# Patient Record
Sex: Male | Born: 1955 | Race: White | Hispanic: No | Marital: Married | Smoking: Current every day smoker
Health system: Southern US, Community
[De-identification: ages and names within clinical notes are randomized; demographics above are authoritative.]

## PROBLEM LIST (undated history)

## (undated) HISTORY — PX: KNEE SURGERY: SHX244

---

## 2006-11-11 ENCOUNTER — Ambulatory Visit (HOSPITAL_COMMUNITY): Admission: RE | Admit: 2006-11-11 | Discharge: 2006-11-11 | Payer: Self-pay | Admitting: Family Medicine

## 2009-12-14 ENCOUNTER — Encounter: Admission: RE | Admit: 2009-12-14 | Discharge: 2009-12-14 | Payer: Self-pay | Admitting: Optometry

## 2010-09-24 ENCOUNTER — Encounter: Payer: Self-pay | Admitting: Internal Medicine

## 2014-10-31 ENCOUNTER — Inpatient Hospital Stay (HOSPITAL_COMMUNITY)
Admission: EM | Admit: 2014-10-31 | Discharge: 2014-11-02 | DRG: 439 | Disposition: A | Discharge status: Home / Self Care | Payer: BLUE CROSS/BLUE SHIELD | Attending: Internal Medicine | Admitting: Internal Medicine

## 2014-10-31 ENCOUNTER — Emergency Department (HOSPITAL_COMMUNITY): Payer: BLUE CROSS/BLUE SHIELD

## 2014-10-31 ENCOUNTER — Encounter (HOSPITAL_COMMUNITY): Payer: Self-pay | Admitting: *Deleted

## 2014-10-31 DIAGNOSIS — F17200 Nicotine dependence, unspecified, uncomplicated: Secondary | ICD-10-CM | POA: Diagnosis present

## 2014-10-31 DIAGNOSIS — K851 Biliary acute pancreatitis without necrosis or infection: Secondary | ICD-10-CM | POA: Diagnosis present

## 2014-10-31 DIAGNOSIS — R52 Pain, unspecified: Secondary | ICD-10-CM

## 2014-10-31 DIAGNOSIS — J9811 Atelectasis: Secondary | ICD-10-CM | POA: Diagnosis present

## 2014-10-31 DIAGNOSIS — Z801 Family history of malignant neoplasm of trachea, bronchus and lung: Secondary | ICD-10-CM

## 2014-10-31 DIAGNOSIS — Z803 Family history of malignant neoplasm of breast: Secondary | ICD-10-CM | POA: Diagnosis not present

## 2014-10-31 DIAGNOSIS — D696 Thrombocytopenia, unspecified: Secondary | ICD-10-CM | POA: Diagnosis present

## 2014-10-31 DIAGNOSIS — E876 Hypokalemia: Secondary | ICD-10-CM | POA: Diagnosis present

## 2014-10-31 DIAGNOSIS — Z720 Tobacco use: Secondary | ICD-10-CM

## 2014-10-31 DIAGNOSIS — R109 Unspecified abdominal pain: Secondary | ICD-10-CM | POA: Diagnosis present

## 2014-10-31 DIAGNOSIS — R1031 Right lower quadrant pain: Secondary | ICD-10-CM | POA: Diagnosis present

## 2014-10-31 LAB — URINALYSIS, ROUTINE W REFLEX MICROSCOPIC
GLUCOSE, UA: NEGATIVE mg/dL
HGB URINE DIPSTICK: NEGATIVE
Ketones, ur: NEGATIVE mg/dL
Leukocytes, UA: NEGATIVE
NITRITE: NEGATIVE
PROTEIN: NEGATIVE mg/dL
Specific Gravity, Urine: 1.01 (ref 1.005–1.030)
Urobilinogen, UA: 2 mg/dL — ABNORMAL HIGH (ref 0.0–1.0)
pH: 5.5 (ref 5.0–8.0)

## 2014-10-31 LAB — CBC WITH DIFFERENTIAL/PLATELET
BASOS PCT: 0 % (ref 0–1)
Basophils Absolute: 0 10*3/uL (ref 0.0–0.1)
EOS ABS: 0 10*3/uL (ref 0.0–0.7)
Eosinophils Relative: 0 % (ref 0–5)
HEMATOCRIT: 42.6 % (ref 39.0–52.0)
HEMOGLOBIN: 14.8 g/dL (ref 13.0–17.0)
LYMPHS ABS: 0.6 10*3/uL — AB (ref 0.7–4.0)
LYMPHS PCT: 5 % — AB (ref 12–46)
MCH: 31.8 pg (ref 26.0–34.0)
MCHC: 34.7 g/dL (ref 30.0–36.0)
MCV: 91.6 fL (ref 78.0–100.0)
Monocytes Absolute: 0.8 10*3/uL (ref 0.1–1.0)
Monocytes Relative: 6 % (ref 3–12)
NEUTROS ABS: 11.8 10*3/uL — AB (ref 1.7–7.7)
NEUTROS PCT: 90 % — AB (ref 43–77)
Platelets: 119 10*3/uL — ABNORMAL LOW (ref 150–400)
RBC: 4.65 MIL/uL (ref 4.22–5.81)
RDW: 12.9 % (ref 11.5–15.5)
WBC: 13.2 10*3/uL — AB (ref 4.0–10.5)

## 2014-10-31 LAB — COMPREHENSIVE METABOLIC PANEL
ALBUMIN: 3.5 g/dL (ref 3.5–5.2)
ALK PHOS: 175 U/L — AB (ref 39–117)
ALT: 354 U/L — ABNORMAL HIGH (ref 0–53)
ANION GAP: 1 — AB (ref 5–15)
AST: 213 U/L — ABNORMAL HIGH (ref 0–37)
BUN: 15 mg/dL (ref 6–23)
CHLORIDE: 107 mmol/L (ref 96–112)
CO2: 28 mmol/L (ref 19–32)
Calcium: 8.4 mg/dL (ref 8.4–10.5)
Creatinine, Ser: 0.88 mg/dL (ref 0.50–1.35)
GFR calc Af Amer: >90 mL/min (ref >90)
GLUCOSE: 134 mg/dL — AB (ref 70–99)
POTASSIUM: 3.2 mmol/L — AB (ref 3.5–5.1)
SODIUM: 136 mmol/L (ref 135–145)
Total Bilirubin: 3.9 mg/dL — ABNORMAL HIGH (ref 0.3–1.2)
Total Protein: 6.6 g/dL (ref 6.0–8.3)

## 2014-10-31 LAB — LIPASE, BLOOD: Lipase: >3000 U/L — ABNORMAL HIGH (ref 11–59)

## 2014-10-31 MED ORDER — HYDROMORPHONE HCL 1 MG/ML IJ SOLN
1.0000 mg | Freq: Once | INTRAMUSCULAR | Status: AC
  Administered 2014-10-31: 1 mg via INTRAVENOUS
  Filled 2014-10-31: qty 1

## 2014-10-31 MED ORDER — PIPERACILLIN-TAZOBACTAM 3.375 G IVPB 30 MIN
3.3750 g | Freq: Once | INTRAVENOUS | Status: AC
  Administered 2014-10-31: 3.375 g via INTRAVENOUS
  Filled 2014-10-31: qty 50

## 2014-10-31 MED ORDER — SODIUM CHLORIDE 0.9 % IV BOLUS (SEPSIS)
2000.0000 mL | Freq: Once | INTRAVENOUS | Status: AC
  Administered 2014-10-31: 2000 mL via INTRAVENOUS

## 2014-10-31 MED ORDER — ENOXAPARIN SODIUM 40 MG/0.4ML ~~LOC~~ SOLN
40.0000 mg | SUBCUTANEOUS | Status: DC
  Administered 2014-10-31 – 2014-11-02 (×3): 40 mg via SUBCUTANEOUS
  Filled 2014-10-31 (×3): qty 0.4

## 2014-10-31 MED ORDER — ONDANSETRON HCL 4 MG/2ML IJ SOLN
4.0000 mg | Freq: Once | INTRAMUSCULAR | Status: AC
  Administered 2014-10-31: 4 mg via INTRAVENOUS
  Filled 2014-10-31: qty 2

## 2014-10-31 MED ORDER — ONDANSETRON HCL 4 MG/2ML IJ SOLN: 4.0000 mg | Freq: Four times a day (QID) | INTRAMUSCULAR | Status: DC | PRN

## 2014-10-31 MED ORDER — IOHEXOL 300 MG/ML  SOLN
100.0000 mL | Freq: Once | INTRAMUSCULAR | Status: AC | PRN
  Administered 2014-10-31: 100 mL via INTRAVENOUS

## 2014-10-31 MED ORDER — ACETAMINOPHEN 325 MG PO TABS
650.0000 mg | ORAL_TABLET | Freq: Four times a day (QID) | ORAL | Status: DC | PRN
  Administered 2014-10-31: 650 mg via ORAL
  Filled 2014-10-31: qty 2

## 2014-10-31 MED ORDER — ONDANSETRON HCL 4 MG PO TABS: 4.0000 mg | ORAL_TABLET | Freq: Four times a day (QID) | ORAL | Status: DC | PRN

## 2014-10-31 MED ORDER — PIPERACILLIN-TAZOBACTAM 3.375 G IVPB
3.3750 g | Freq: Three times a day (TID) | INTRAVENOUS | Status: DC
  Administered 2014-10-31 – 2014-11-02 (×6): 3.375 g via INTRAVENOUS
  Filled 2014-10-31 (×9): qty 50

## 2014-10-31 MED ORDER — ACETAMINOPHEN 650 MG RE SUPP: 650.0000 mg | Freq: Four times a day (QID) | RECTAL | Status: DC | PRN

## 2014-10-31 MED ORDER — POTASSIUM CHLORIDE IN NACL 40-0.9 MEQ/L-% IV SOLN
INTRAVENOUS | Status: DC
  Administered 2014-10-31 – 2014-11-02 (×5): 125 mL/h via INTRAVENOUS

## 2014-10-31 MED ORDER — IOHEXOL 300 MG/ML  SOLN
50.0000 mL | Freq: Once | INTRAMUSCULAR | Status: AC | PRN
  Administered 2014-10-31: 50 mL via ORAL

## 2014-10-31 MED ORDER — MORPHINE SULFATE 2 MG/ML IJ SOLN
2.0000 mg | INTRAMUSCULAR | Status: DC | PRN
  Administered 2014-10-31 – 2014-11-01 (×3): 2 mg via INTRAVENOUS
  Filled 2014-10-31 (×3): qty 1

## 2014-10-31 NOTE — Progress Notes (Signed)
Utilization review Completed Angeliah Wisdom RN BSN   

## 2014-10-31 NOTE — Progress Notes (Signed)
ANTIBIOTIC CONSULT NOTE - INITIAL  Pharmacy Consult for Zosyn Indication: intra-abdominal infection  Allergies  Allergen Reactions  . Other     Unknown antibiotic for strep throat.    Patient Measurements: Height: 6\' 1"  (185.4 cm) Weight: 162 lb 4.8 oz (73.619 kg) IBW/kg (Calculated) : 79.9  Vital Signs: Temp: 97.6 F (36.4 C) (02/28 1245) Temp Source: Oral (02/28 1245) BP: 122/90 mmHg (02/28 1245) Pulse Rate: 68 (02/28 1245) Intake/Output from previous day:   Intake/Output from this shift:    Labs:  Recent Labs  10/31/14 0821  WBC 13.2*  HGB 14.8  PLT 119*  CREATININE 0.88   Estimated Creatinine Clearance: 92.9 mL/min (by C-G formula based on Cr of 0.88). No results for input(s): VANCOTROUGH, VANCOPEAK, VANCORANDOM, GENTTROUGH, GENTPEAK, GENTRANDOM, TOBRATROUGH, TOBRAPEAK, TOBRARND, AMIKACINPEAK, AMIKACINTROU, AMIKACIN in the last 72 hours.   Microbiology: No results found for this or any previous visit (from the past 720 hour(s)).  Medical History: History reviewed. No pertinent past medical history.  Zosyn 2/28 >>  Assessment: 59yo male with good renal fxn.  Estimated Creatinine Clearance: 92.9 mL/min (by C-G formula based on Cr of 0.88).  Pt c/o abdominal pain.  Goal of Therapy:  Eradicate infection.  Plan:  Zosyn 3.375gm IV q8h, each dose over 4 hrs Monitor labs and progress  Valrie HartHall, Husam Hohn A 10/31/2014,2:43 PM

## 2014-10-31 NOTE — H&P (Signed)
[facility] History and Physical  DOMENICK QUEBEDEAUX ZOX:[id number] DOB: [birth year] DOA: 10/31/2014  Referring physician: ER PCP: No primary care provider on file.   Chief Complaint: Abdominal pain  HPI: Mike Shelton is a 59 y.o. male who presents to the hospital with abdominal pain. She describes it as lower abdominal pain which began last night. He reports similar episodes in the past, but these resolved on their own. His pain began last night and has been continuous since then. He did have some episodes of vomiting. He has not had any diarrhea, no fever, no chest pain, shortness of breath, no dysuria. He does not drink alcohol. He was evaluated in the emergency room with CT imaging indicated pancreatitis with possible gallstone etiology. He is being admitted for further treatments.   Review of Systems:  Pertinent positives as per history of present illness, otherwise negative  History reviewed. No pertinent past medical history. Past Surgical History  Procedure Laterality Date  . Knee surgery     Social History:  reports that he has been smoking.  He has never used smokeless tobacco. He reports that he does not drink alcohol or use illicit drugs.  Allergies  Allergen Reactions  . Other     Unknown antibiotic for strep throat.    Family history: Father died of lung cancer, mother died of breast cancer  Prior to Admission medications   Medication Sig Start Date End Date Taking? Authorizing Provider  Chlorpheniramine Maleate (ALLERGY RELIEF PO) Take 1 tablet by mouth 2 (two) times daily as needed (cold).   Yes Historical Provider, MD   Physical Exam: Filed Vitals:   10/31/14 0742 10/31/14 1055 10/31/14 1217 10/31/14 1245  BP: 104/75 123/88 117/94 122/90  Pulse: 60 62 73 68  Temp: 97.7 F (36.5 C) 97.7 F (36.5 C) 97.6 F (36.4 C) 97.6 F (36.4 C)  TempSrc: Oral Oral Oral Oral  Resp: Height:  (1.854 m)    (1.854 m)  Weight: 76.658 kg (169 lb)    73.619 kg (162 lb 4.8 oz)  SpO2: 97% 97% 95% 98%    Wt Readings from Last 3 Encounters:  10/31/14 73.619 kg (162 lb 4.8 oz)    General:  Appears calm and comfortable Eyes: PERRL, normal lids, irises & conjunctiva ENT: grossly normal hearing, lips & tongue Neck: no LAD, masses or thyromegaly Cardiovascular: RRR, no m/r/g. No LE edema. Telemetry: SR, no arrhythmias  Respiratory: CTA bilaterally, no w/r/r. Normal respiratory effort. Abdomen: soft, tender in RUQ and periumbilical area, bs+ Skin: no rash or induration seen on limited exam Musculoskeletal: grossly normal tone BUE/BLE Psychiatric: grossly normal mood and affect, speech fluent and appropriate Neurologic: grossly non-focal.          Labs on Admission:  Basic Metabolic Panel:  Recent Labs Lab 10/31/14 0821  NA 136  K 3.2*  CL 107  CO2 28  GLUCOSE 134*  BUN 15  CREATININE 0.88  CALCIUM 8.4   Liver Function Tests:  Recent Labs Lab 10/31/14 0821  AST 213*  ALT 354*  ALKPHOS 175*  BILITOT 3.9*  PROT 6.6  ALBUMIN 3.5    Recent Labs Lab 10/31/14 0821  LIPASE >3000*   No results for input(s): AMMONIA in the last 168 hours. CBC:  Recent Labs Lab 10/31/14 0821  WBC 13.2*  NEUTROABS 11.8*  HGB 14.8  HCT 42.6  MCV 91.6  PLT 119*   Cardiac Enzymes: No results for input(s): CKTOTAL, CKMB,  CKMBINDEX, TROPONINI in the last 168 hours.  BNP (last 3 results) No results for input(s): BNP in the last 8760 hours.  ProBNP (last 3 results) No results for input(s): PROBNP in the last 8760 hours.  CBG: No results for input(s): GLUCAP in the last 168 hours.  Radiological Exams on Admission: Ct Abdomen Pelvis W Contrast  10/31/2014   CLINICAL DATA:  Acute onset abdominal pain.  Right-sided pain.  EXAM: CT ABDOMEN AND PELVIS WITH CONTRAST  TECHNIQUE: Multidetector CT imaging of the abdomen and pelvis was performed using the standard protocol following bolus administration of intravenous contrast.   CONTRAST:  50mL OMNIPAQUE IOHEXOL 300 MG/ML SOLN, 100mL OMNIPAQUE IOHEXOL 300 MG/ML SOLN  COMPARISON:  None.  FINDINGS: Lower chest: Lung base stones demonstrate mild atelectasis and interstitial edema.  Hepatobiliary: No focal hepatic lesion. There is mild periportal edema. Small amount of pericholecystic fluid. The gallbladder is mildly distended to 45 mm. The common bile duct is dilated the pancreatic head to 9 mm. No obstructing lesion identified. No pancreatic duct dilatation.  Pancreas: There is extensive peripancreatic fluid extending along the left and right anterior perirenal fascia but greater on the left (image 43, series 2). There is edema and fluid at the tail. No organized fluid collections. The pancreatic parenchyma is mildly hypo attenuating. No organized fluid collections. No pancreatic duct dilatation.  Spleen: Normal spleen.  Adrenals/urinary tract: Adrenal glands are normal. The kidneys, ureters, and bladder are normal. There is a fat density lesion within the cortex of the right kidney measuring 16 mm (image 45, series 2). 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  Stomach/Bowel: The stomach, duodenum, small bowel, appendix, cecum normal. The colon and rectosigmoid colon are normal.  Vascular/Lymphatic: Abdominal or is normal caliber. No vascular complication. Portal veins are patent. Splenic vein is patent.  Reproductive: Prostate gland is normal.  Musculoskeletal: No aggressive osseous lesion. There are multiple small sclerotic lesions within the bones of the pelvis, sacrum, and spine. These likely represent benign bone islands but are indeterminate.  IMPRESSION:  1. Acute pancreatitis.  No organized fluid collection. 2. Dilatation of the common bile duct and distension of gallbladder. Concern for gallstone pancreatitis. No gallstones are evident by CT. Consider ultrasound or MRCP evaluation for non radiodense stones. 3. Multiple small sclerotic lesions throughout the pelvis and spine.  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   Electronically Signed   By: Genevive BiStewart  Edmunds M.D.   On: 10/31/2014 10:36    Assessment/Plan Active Problems:   Abdominal pain   Acute gallstone pancreatitis   Hypokalemia   Tobacco use disorder   Gallstone pancreatitis   1. Acute gallstone pancreatitis. Patient has CT imaging as well as biochemical markers consistent with gallstone pancreatitis. His serum lipase is markedly elevated and he also has elevation of his liver enzymes. Will need MRCP to further characterize any other biliary stones. General surgery has already seen the patient. Will request gastroenterology consultation to see if he needs an ERCP. Treat supportively at this time with bowel rest, IV fluids and pain management. We'll advance diet as his acute episode resolves. He has been started on antibiotic coverage with Zosyn. 2. Hypokalemia. Replace 3. Tobacco use. Counseled on the importance of tobacco cessation. 4. Thrombocytopenia. No prior labs for comparison. May be reactive to acute episode. Continue to follow..  Code Status: full code DVT Prophylaxis: lovenox Family Communication: discussed with patient Disposition Plan: discharge home once  improved  Time spent:  [facility] Pager [phone]

## 2014-10-31 NOTE — ED Provider Notes (Signed)
CSN: [id number]     Arrival date & time 10/31/14  [id number] History  This chart was scribed for Mike LennertJoseph L Barnet Benavides, MD by Mike Shelton, ED Scribe. This patient was seen in room APA19/APA19 and the patient's care was started at 7:58 AM.    Chief Complaint  Patient presents with  . Emesis   Patient is a 59 y.o. male presenting with abdominal pain. The history is provided by the patient and medical records. No language interpreter was used.  Abdominal Pain Pain location:  Suprapubic and RLQ Pain radiates to:  Does not radiate Pain severity:  Severe Onset quality:  Sudden Duration:  8 hours Timing:  Constant Progression:  Worsening Chronicity:  New Relieved by:  Nothing Worsened by:  Nothing tried Ineffective treatments:  None tried Associated symptoms: vomiting   Associated symptoms: no chest pain, no cough, no diarrhea, no fatigue and no hematuria   Associated symptoms comment:  Per nursing notes, there was associated vomiting which began at the same time    HPI Comments: Mike Shelton is a 59 y.o. male who presents to the Emergency Department complaining of right sided abdominal pain that began 11:30 pm last night, about 10 hours ago. Patient denies a history of abdominal surgeries.   History reviewed. No pertinent past medical history. Past Surgical History  Procedure Laterality Date  . Knee surgery     No family history on file. History  Substance Use Topics  . Smoking status: Current Every Day Smoker  . Smokeless tobacco: Not on file  . Alcohol Use: No    Review of Systems  Constitutional: Negative for appetite change and fatigue.  HENT: Negative for congestion, ear discharge and sinus pressure.   Eyes: Negative for discharge.  Respiratory: Negative for cough.   Cardiovascular: Negative for chest pain.  Gastrointestinal: Positive for vomiting and abdominal pain. Negative for diarrhea.  Genitourinary: Negative for frequency and hematuria.  Musculoskeletal: Negative for back  pain.  Skin: Negative for rash.  Neurological: Negative for seizures and headaches.  Psychiatric/Behavioral: Negative for hallucinations.      Allergies  Other  Home Medications   Prior to Admission medications   Not on File   BP 104/75 mmHg  Pulse 60  Temp(Src) 97.7 F (36.5 C) (Oral)  Resp 16  Ht 6\' 1"  (1.854 m)  Wt 169 lb (76.658 kg)  BMI 22.30 kg/m2  SpO2 97% Physical Exam  Abdominal:  Moderate suprapubic and RLQ tenderness.    ED Course  Procedures (including critical care time)  DIAGNOSTIC STUDIES: Oxygen Saturation is 97% on room air, normal by my interpretation.    COORDINATION OF CARE: 7:59 AM - Discussed treatment plan with pt at bedside which includes medic and pt agreed to plan.   Labs Review Labs Reviewed - No data to display  Imaging Review No results found.   EKG Interpretation None      MDM   Final diagnoses:  None    Gall stone pancreatitis,  Admit to medicine,  Surgery and gi consulting   Mike LennertJoseph L Devyon Keator, MD 10/31/14 1131

## 2014-10-31 NOTE — ED Notes (Signed)
Vomiting began at 2330 last night along with abdominal pain

## 2014-11-01 ENCOUNTER — Inpatient Hospital Stay (HOSPITAL_COMMUNITY): Payer: BLUE CROSS/BLUE SHIELD

## 2014-11-01 DIAGNOSIS — J9811 Atelectasis: Secondary | ICD-10-CM | POA: Diagnosis not present

## 2014-11-01 LAB — COMPREHENSIVE METABOLIC PANEL
ALT: 212 U/L — ABNORMAL HIGH (ref 0–53)
AST: 77 U/L — AB (ref 0–37)
Albumin: 3 g/dL — ABNORMAL LOW (ref 3.5–5.2)
Alkaline Phosphatase: 128 U/L — ABNORMAL HIGH (ref 39–117)
Anion gap: 8 (ref 5–15)
BILIRUBIN TOTAL: 2.2 mg/dL — AB (ref 0.3–1.2)
BUN: 14 mg/dL (ref 6–23)
CALCIUM: 8.4 mg/dL (ref 8.4–10.5)
CHLORIDE: 109 mmol/L (ref 96–112)
CO2: 25 mmol/L (ref 19–32)
CREATININE: 0.83 mg/dL (ref 0.50–1.35)
GFR calc Af Amer: >90 mL/min (ref >90)
GFR calc non Af Amer: >90 mL/min (ref >90)
Glucose, Bld: 71 mg/dL (ref 70–99)
Potassium: 3.9 mmol/L (ref 3.5–5.1)
Sodium: 142 mmol/L (ref 135–145)
Total Protein: 5.8 g/dL — ABNORMAL LOW (ref 6.0–8.3)

## 2014-11-01 LAB — CBC
HCT: 39.9 % (ref 39.0–52.0)
Hemoglobin: 13.8 g/dL (ref 13.0–17.0)
MCH: 32.1 pg (ref 26.0–34.0)
MCHC: 34.6 g/dL (ref 30.0–36.0)
MCV: 92.8 fL (ref 78.0–100.0)
Platelets: 121 10*3/uL — ABNORMAL LOW (ref 150–400)
RBC: 4.3 MIL/uL (ref 4.22–5.81)
RDW: 13.3 % (ref 11.5–15.5)
WBC: 11.1 10*3/uL — AB (ref 4.0–10.5)

## 2014-11-01 LAB — PSA: PSA: 0.72 ng/mL (ref <=4.00)

## 2014-11-01 LAB — LIPASE, BLOOD: Lipase: 1146 U/L — ABNORMAL HIGH (ref 11–59)

## 2014-11-01 NOTE — Care Management Note (Addendum)
    Page 1 of 1   11/02/2014     2:34:29 PM CARE MANAGEMENT NOTE 11/02/2014  Patient:  Mike Shelton,Mike Shelton   Account Number:  [id number]  Date Initiated:  11/01/2014  Documentation initiated by:  Sharrie RothmanBLACKWELL,Zeffie Bickert C  Subjective/Objective Assessment:   Pt admitted from home with pancreatitis. Pt lives with his wife and will return home at discharge. Pt is independent with ADL's.     Action/Plan:   No CM needs noted.   Anticipated DC Date:  11/04/2014   Anticipated DC Plan:  HOME/SELF CARE      DC Planning Services  CM consult      Choice offered to / List presented to:             Status of service:  Completed, signed off Medicare Important Message given?   (If response is "NO", the following Medicare IM given date fields will be blank) Date Medicare IM given:   Medicare IM given by:   Date Additional Medicare IM given:   Additional Medicare IM given by:    Discharge Disposition:  HOME/SELF CARE  Per UR Regulation:    If discussed at Long Length of Stay Meetings, dates discussed:    Comments:  11/02/14 1430 Arlyss Queenammy Alfonso Shackett, RBN BSN CM Pt discharged home today. No CM needs noted.  11/01/14 1520 Arlyss Queenammy Toren Tucholski, RN BSN CM

## 2014-11-01 NOTE — Consult Note (Signed)
Reason for Consult: Gallstone pancreatitis Referring Physician: Hospitalist  Mike Shelton is an 59 y.o. male.  HPI: Patient is a 59 year old white male who presented to emergency room with worsening epigastric and diffuse abdominal pain. He was found on CT scan the abdomen to have a thickened gallbladder wall, dilated common bile duct, and inflammatory changes around the pancreas. Laboratory tests were consistent with gallstone pancreatitis. He was admitted the hospital for further management treatment of his pancreatitis. He was started on Zosyn due to his elevated bilirubin level and leukocytosis. Today, he underwent MRCP which did not show a common bile duct stone. His common bile duct appeared smaller in size. His liver enzyme tests as well as lipase are normalizing. He has developed some shortness of breath. He does have emphysema and smokes. Still has some upper abdominal pain.  History reviewed. No pertinent past medical history.  Past Surgical History  Procedure Laterality Date  . Knee surgery      History reviewed. No pertinent family history.  Social History:  reports that he has been smoking.  He has never used smokeless tobacco. He reports that he does not drink alcohol or use illicit drugs.  Allergies:  Allergies  Allergen Reactions  . Other     Unknown antibiotic for strep throat.    Medications: I have reviewed the patient's current medications.  Results for orders placed or performed during the hospital encounter of 10/31/14 (from the past 48 hour(s))  CBC with Differential/Platelet     Status: Abnormal   Collection Time: 10/31/14  8:21 AM  Result Value Ref Range   WBC 13.2 (H) 4.0 - 10.5 K/uL   RBC 4.65 4.22 - 5.81 MIL/uL   Hemoglobin 14.8 13.0 - 17.0 g/dL   HCT 42.6 39.0 - 52.0 %   MCV 91.6 78.0 - 100.0 fL   MCH 31.8 26.0 - 34.0 pg   MCHC 34.7 30.0 - 36.0 g/dL   RDW 12.9 11.5 - 15.5 %   Platelets 119 (L) 150 - 400 K/uL    Comment: SPECIMEN CHECKED FOR  CLOTS LARGE PLATELETS PRESENT PLATELET COUNT CONFIRMED BY SMEAR    Neutrophils Relative % 90 (H) 43 - 77 %   Neutro Abs 11.8 (H) 1.7 - 7.7 K/uL   Lymphocytes Relative 5 (L) 12 - 46 %   Lymphs Abs 0.6 (L) 0.7 - 4.0 K/uL   Monocytes Relative 6 3 - 12 %   Monocytes Absolute 0.8 0.1 - 1.0 K/uL   Eosinophils Relative 0 0 - 5 %   Eosinophils Absolute 0.0 0.0 - 0.7 K/uL   Basophils Relative 0 0 - 1 %   Basophils Absolute 0.0 0.0 - 0.1 K/uL  Comprehensive metabolic panel     Status: Abnormal   Collection Time: 10/31/14  8:21 AM  Result Value Ref Range   Sodium 136 135 - 145 mmol/L   Potassium 3.2 (L) 3.5 - 5.1 mmol/L   Chloride 107 96 - 112 mmol/L   CO2 28 19 - 32 mmol/L   Glucose, Bld 134 (H) 70 - 99 mg/dL   BUN 15 6 - 23 mg/dL   Creatinine, Ser 0.88 0.50 - 1.35 mg/dL   Calcium 8.4 8.4 - 10.5 mg/dL   Total Protein 6.6 6.0 - 8.3 g/dL   Albumin 3.5 3.5 - 5.2 g/dL   AST 213 (H) 0 - 37 U/L   ALT 354 (H) 0 - 53 U/L   Alkaline Phosphatase 175 (H) 39 - 117 U/L   Total  Bilirubin 3.9 (H) 0.3 - 1.2 mg/dL   GFR calc non Af Amer >90 >90 mL/min   GFR calc Af Amer >90 >90 mL/min    Comment: (NOTE) The eGFR has been calculated using the CKD EPI equation. This calculation has not been validated in all clinical situations. eGFR's persistently <90 mL/min signify possible Chronic Kidney Disease.    Anion gap 1 (L) 5 - 15  Lipase, blood     Status: Abnormal   Collection Time: 10/31/14  8:21 AM  Result Value Ref Range   Lipase >3000 (H) 11 - 59 U/L    Comment: RESULTS CONFIRMED BY MANUAL DILUTION  Urinalysis, Routine w reflex microscopic     Status: Abnormal   Collection Time: 10/31/14 10:39 AM  Result Value Ref Range   Color, Urine AMBER (A) YELLOW    Comment: BIOCHEMICALS MAY BE AFFECTED BY COLOR   APPearance CLEAR CLEAR   Specific Gravity, Urine 1.010 1.005 - 1.030   pH 5.5 5.0 - 8.0   Glucose, UA NEGATIVE NEGATIVE mg/dL   Hgb urine dipstick NEGATIVE NEGATIVE   Bilirubin Urine MODERATE  (A) NEGATIVE   Ketones, ur NEGATIVE NEGATIVE mg/dL   Protein, ur NEGATIVE NEGATIVE mg/dL   Urobilinogen, UA 2.0 (H) 0.0 - 1.0 mg/dL   Nitrite NEGATIVE NEGATIVE   Leukocytes, UA NEGATIVE NEGATIVE    Comment: MICROSCOPIC NOT DONE ON URINES WITH NEGATIVE PROTEIN, BLOOD, LEUKOCYTES, NITRITE, OR GLUCOSE <1000 mg/dL.  Comprehensive metabolic panel     Status: Abnormal   Collection Time: 11/01/14  6:46 AM  Result Value Ref Range   Sodium 142 135 - 145 mmol/L   Potassium 3.9 3.5 - 5.1 mmol/L    Comment: DELTA CHECK NOTED   Chloride 109 96 - 112 mmol/L   CO2 25 19 - 32 mmol/L   Glucose, Bld 71 70 - 99 mg/dL   BUN 14 6 - 23 mg/dL   Creatinine, Ser 0.83 0.50 - 1.35 mg/dL   Calcium 8.4 8.4 - 10.5 mg/dL   Total Protein 5.8 (L) 6.0 - 8.3 g/dL   Albumin 3.0 (L) 3.5 - 5.2 g/dL   AST 77 (H) 0 - 37 U/L   ALT 212 (H) 0 - 53 U/L   Alkaline Phosphatase 128 (H) 39 - 117 U/L   Total Bilirubin 2.2 (H) 0.3 - 1.2 mg/dL   GFR calc non Af Amer >90 >90 mL/min   GFR calc Af Amer >90 >90 mL/min    Comment: (NOTE) The eGFR has been calculated using the CKD EPI equation. This calculation has not been validated in all clinical situations. eGFR's persistently <90 mL/min signify possible Chronic Kidney Disease.    Anion gap 8 5 - 15  CBC     Status: Abnormal   Collection Time: 11/01/14  6:46 AM  Result Value Ref Range   WBC 11.1 (H) 4.0 - 10.5 K/uL   RBC 4.30 4.22 - 5.81 MIL/uL   Hemoglobin 13.8 13.0 - 17.0 g/dL   HCT 39.9 39.0 - 52.0 %   MCV 92.8 78.0 - 100.0 fL   MCH 32.1 26.0 - 34.0 pg   MCHC 34.6 30.0 - 36.0 g/dL   RDW 13.3 11.5 - 15.5 %   Platelets 121 (L) 150 - 400 K/uL  Lipase, blood     Status: Abnormal   Collection Time: 11/01/14  6:46 AM  Result Value Ref Range   Lipase 1146 (H) 11 - 59 U/L    Ct Abdomen Pelvis W Contrast  10/31/2014  CLINICAL DATA:  Acute onset abdominal pain.  Right-sided pain.  EXAM: CT ABDOMEN AND PELVIS WITH CONTRAST  TECHNIQUE: Multidetector CT imaging of the  abdomen and pelvis was performed using the standard protocol following bolus administration of intravenous contrast.  CONTRAST:  55m OMNIPAQUE IOHEXOL 300 MG/ML SOLN, 1071mOMNIPAQUE IOHEXOL 300 MG/ML SOLN  COMPARISON:  None.  FINDINGS: Lower chest: Lung base stones demonstrate mild atelectasis and interstitial edema.  Hepatobiliary: No focal hepatic lesion. There is mild periportal edema. Small amount of pericholecystic fluid. The gallbladder is mildly distended to 45 mm. The common bile duct is dilated the pancreatic head to 9 mm. No obstructing lesion identified. No pancreatic duct dilatation.  Pancreas: There is extensive peripancreatic fluid extending along the left and right anterior perirenal fascia but greater on the left (image 43, series 2). There is edema and fluid at the tail. No organized fluid collections. The pancreatic parenchyma is mildly hypo attenuating. No organized fluid collections. No pancreatic duct dilatation.  Spleen: Normal spleen.  Adrenals/urinary tract: Adrenal glands are normal. The kidneys, ureters, and bladder are normal. There is a fat density lesion within the cortex of the right kidney measuring 16 mm (image 45, series 2). 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  Stomach/Bowel: The stomach, duodenum, small bowel, appendix, cecum normal. The colon and rectosigmoid colon are normal.  Vascular/Lymphatic: Abdominal or is normal caliber. No vascular complication. Portal veins are patent. Splenic vein is patent.  Reproductive: Prostate gland is normal.  Musculoskeletal: No aggressive osseous lesion. There are multiple small sclerotic lesions within the bones of the pelvis, sacrum, and spine. These likely represent benign bone islands but are indeterminate.  IMPRESSION:  1. Acute pancreatitis.  No organized fluid collection. 2. Dilatation of the common bile duct and distension of gallbladder. Concern for gallstone pancreatitis. No gallstones are evident by CT. Consider ultrasound  or MRCP evaluation for non radiodense stones. 3. Multiple small sclerotic lesions throughout the pelvis and spine. 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   Electronically Signed   By: StSuzy Bouchard.D.   On: 10/31/2014 10:36   Mr Abdomen Mrcp Wo Cm  11/01/2014   CLINICAL DATA:  Pancreatitis and common duct dilatation. Evaluate for choledocholithiasis.  EXAM: MRI ABDOMEN WITHOUT CONTRAST  (INCLUDING MRCP)  TECHNIQUE: Multiplanar multisequence MR imaging of the abdomen was performed. Heavily T2-weighted images of the biliary and pancreatic ducts were obtained, and three-dimensional MRCP images were rendered by post processing.  COMPARISON:  CT of 1 day prior  FINDINGS: Portions of exam are mildly motion degraded.  Lower chest: Development of right greater than left bibasilar airspace disease. Mild cardiomegaly without significant pleural fluid.  Hepatobiliary: Normal liver. No intrahepatic ductal dilatation.Gallbladder distention is similar, mild.  The common duct is upper normal in size, measuring 7 millimeters in the porta hepatis on image 56 of series 6. Tapering to 6 millimeters in the pancreatic head on image 53 of series 6. Decreased since the CT of 1 day prior. There is a periampullary small duodenal diverticulum. No convincing evidence of choledocholithiasis, given motion.  Pancreas: Redemonstration of mild to moderate uncomplicated pancreatitis. No ductal dilatation.  Spleen: Normal  Adrenals/Urinary Tract: Normal adrenal glands. Normal kidneys, without hydronephrosis.  Stomach/Bowel: Normal stomach and abdominal bowel loops.  Vascular/Lymphatic: Normal caliber of the aorta and branch vessels. No abdominal adenopathy.  Other: Trace perisplenic ascites, similar.  Musculoskeletal: Minimal S-shaped thoracolumbar spine curvature.  IMPRESSION: 1.  Mild-to-moderate pancreatitis. 2. Persistent gallbladder  distension with decrease to resolution of common duct distension. Although there is mild motion artifact, no convincing evidence of choledocholithiasis is seen. Question recent stone passage, given decreased in common duct size. 3. Development of bibasilar airspace disease. Given time course, favor atelectasis or aspiration. 4. Mild motion degradation.   Electronically Signed   By: Abigail Miyamoto M.D.   On: 11/01/2014 07:54   Mr 3d Recon At Scanner  11/01/2014   CLINICAL DATA:  Pancreatitis and common duct dilatation. Evaluate for choledocholithiasis.  EXAM: MRI ABDOMEN WITHOUT CONTRAST  (INCLUDING MRCP)  TECHNIQUE: Multiplanar multisequence MR imaging of the abdomen was performed. Heavily T2-weighted images of the biliary and pancreatic ducts were obtained, and three-dimensional MRCP images were rendered by post processing.  COMPARISON:  CT of 1 day prior  FINDINGS: Portions of exam are mildly motion degraded.  Lower chest: Development of right greater than left bibasilar airspace disease. Mild cardiomegaly without significant pleural fluid.  Hepatobiliary: Normal liver. No intrahepatic ductal dilatation.Gallbladder distention is similar, mild.  The common duct is upper normal in size, measuring 7 millimeters in the porta hepatis on image 56 of series 6. Tapering to 6 millimeters in the pancreatic head on image 53 of series 6. Decreased since the CT of 1 day prior. There is a periampullary small duodenal diverticulum. No convincing evidence of choledocholithiasis, given motion.  Pancreas: Redemonstration of mild to moderate uncomplicated pancreatitis. No ductal dilatation.  Spleen: Normal  Adrenals/Urinary Tract: Normal adrenal glands. Normal kidneys, without hydronephrosis.  Stomach/Bowel: Normal stomach and abdominal bowel loops.  Vascular/Lymphatic: Normal caliber of the aorta and branch vessels. No abdominal adenopathy.  Other: Trace perisplenic ascites, similar.  Musculoskeletal: Minimal S-shaped  thoracolumbar spine curvature.  IMPRESSION: 1. Mild-to-moderate pancreatitis. 2. Persistent gallbladder distension with decrease to resolution of common duct distension. Although there is mild motion artifact, no convincing evidence of choledocholithiasis is seen. Question recent stone passage, given decreased in common duct size. 3. Development of bibasilar airspace disease. Given time course, favor atelectasis or aspiration. 4. Mild motion degradation.   Electronically Signed   By: Abigail Miyamoto M.D.   On: 11/01/2014 07:54    ROS: See chart Blood pressure 119/71, pulse 92, temperature 98.3 F (36.8 C), temperature source Oral, resp. rate 20, height 6' 1" (1.854 m), weight 73.619 kg (162 lb 4.8 oz), SpO2 95 %. Physical Exam: Pleasant white male in no acute distress. Abdomen is soft with some nonspecific tenderness noted throughout. No rigidity is noted.  Assessment/Plan: Impression: Gallstone pancreatitis, MRCP negative for choledocholithiasis. Liver enzyme tests and bilirubin normalizing. Lipase normalizing. Plan: Patient will need a cholecystectomy. It may not be during this admission due to his upper respiratory problems. May start a clear liquid diet. Will follow with you.  JENKINS,MARK A 11/01/2014, 11:06 AM

## 2014-11-01 NOTE — Progress Notes (Signed)
[facility] PROGRESS NOTE  Mike EvertsLarry W Shelton ZOX:[id number] DOB: 09/03/1956 DOA: 10/31/2014 PCP: No primary care provider on file.  Assessment/Plan: 1. Acute gallstone pancreatitis. Patient has CT imaging as well as biochemical markers consistent with gallstone pancreatitis.Serum lipase trending down significantly and liver enzymes tending down slightly.  MRCP with mild to moderate pancreatitis and persistent gallbladder distention and no convincing evidence of stone which may have passed. Appreciate General surgery input. Will provide clear liquids and continue to treat supportively. Max temp 101.3. Zosyn day #2. monitor 2. Hypokalemia. Resolved this am. monitor 3. Tobacco use. Counseled on the importance of tobacco cessation. Some atelectasis noted on imaging. He does have  Moist non-productive cough. Will start IS. monitor 4. Thrombocytopenia. No prior labs for comparison. Trending up slightly.  May be reactive to acute episode. Continue to follow. 5. Atelectasis: per imaging. Smoker. Moist non-productive cough. Poor cough effort due to pain. Will obtain IS  Code Status: full Family Communication: wife at bedside Disposition Plan: home when ready   Consultants:  GI  Procedures:  MRCP as below  Antibiotics:  Zosyn 10/31/14>>  HPI/Subjective: Lying in bed complaining of abdominal pain. Reports BM this morning  Objective: Filed Vitals:   11/01/14 0548  BP: 119/71  Pulse: 92  Temp: 98.3 F (36.8 C)  Resp: 20    Intake/Output Summary (Last 24 hours) at 11/01/14 1039 Last data filed at 11/01/14 0650  Gross per 24 hour  Intake 4122.92 ml  Output    450 ml  Net 3672.92 ml   Filed Weights   10/31/14 0742 10/31/14 1245  Weight: 76.658 kg (169 lb) 73.619 kg (162 lb 4.8 oz)    Exam:   General:  Appears somewhat uncomfortable   Cardiovascular: RRR no MGR No LE edema  Respiratory: normal effort but somewhat shallow. BS diminished on right base otherwise slightly  coarse. Cough effort weak.   Abdomen: soft very sluggish BS. Diffuse tenderness no guarding or rebounding  Musculoskeletal: no clubbing or cyanosis   Data Reviewed: Basic Metabolic Panel:  Recent Labs Lab 10/31/14 0821 11/01/14 0646  NA 136 142  K 3.2* 3.9  CL 107 109  CO2 28 25  GLUCOSE 134* 71  BUN 15 14  CREATININE 0.88 0.83  CALCIUM 8.4 8.4   Liver Function Tests:  Recent Labs Lab 10/31/14 0821 11/01/14 0646  AST 213* 77*  ALT 354* 212*  ALKPHOS 175* 128*  BILITOT 3.9* 2.2*  PROT 6.6 5.8*  ALBUMIN 3.5 3.0*    Recent Labs Lab 10/31/14 0821 11/01/14 0646  LIPASE >3000* 1146*   No results for input(s): AMMONIA in the last 168 hours. CBC:  Recent Labs Lab 10/31/14 0821 11/01/14 0646  WBC 13.2* 11.1*  NEUTROABS 11.8*  --   HGB 14.8 13.8  HCT 42.6 39.9  MCV 91.6 92.8  PLT 119* 121*   Cardiac Enzymes: No results for input(s): CKTOTAL, CKMB, CKMBINDEX, TROPONINI in the last 168 hours. BNP (last 3 results) No results for input(s): BNP in the last 8760 hours.  ProBNP (last 3 results) No results for input(s): PROBNP in the last 8760 hours.  CBG: No results for input(s): GLUCAP in the last 168 hours.  No results found for this or any previous visit (from the past 240 hour(s)).   Studies: Ct Abdomen Pelvis W Contrast  10/31/2014   CLINICAL DATA:  Acute onset abdominal pain.  Right-sided pain.  EXAM: CT ABDOMEN AND PELVIS WITH CONTRAST  TECHNIQUE: Multidetector CT imaging of the abdomen and pelvis  was performed using the standard protocol following bolus administration of intravenous contrast.  CONTRAST:  50mL OMNIPAQUE IOHEXOL 300 MG/ML SOLN, OMNIPAQUE IOHEXOL 300 MG/ML SOLN  COMPARISON:  None.  FINDINGS: Lower chest: Lung base stones demonstrate mild atelectasis and interstitial edema.  Hepatobiliary: No focal hepatic lesion. There is mild periportal edema. Small amount of pericholecystic fluid. The gallbladder is mildly distended to 45 mm. The  common bile duct is dilated the pancreatic head to 9 mm. No obstructing lesion identified. No pancreatic duct dilatation.  Pancreas: There is extensive peripancreatic fluid extending along the left and right anterior perirenal fascia but greater on the left (image 43, series 2). There is edema and fluid at the tail. No organized fluid collections. The pancreatic parenchyma is mildly hypo attenuating. No organized fluid collections. No pancreatic duct dilatation.  Spleen: Normal spleen.  Adrenals/urinary tract: Adrenal glands are normal. The kidneys, ureters, and bladder are normal. There is a fat density lesion within the cortex of the right kidney measuring 16 mm (image 45, series 2). 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  Stomach/Bowel: The stomach, duodenum, small bowel, appendix, cecum normal. The colon and rectosigmoid colon are normal.  Vascular/Lymphatic: Abdominal or is normal caliber. No vascular complication. Portal veins are patent. Splenic vein is patent.  Reproductive: Prostate gland is normal.  Musculoskeletal: No aggressive osseous lesion. There are multiple small sclerotic lesions within the bones of the pelvis, sacrum, and spine. These likely represent benign bone islands but are indeterminate.  IMPRESSION:  1. Acute pancreatitis.  No organized fluid collection. 2. Dilatation of the common bile duct and distension of gallbladder. Concern for gallstone pancreatitis. No gallstones are evident by CT. Consider ultrasound or MRCP evaluation for non radiodense stones. 3. Multiple small sclerotic lesions throughout the pelvis and spine. 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   Electronically Signed   By: Genevive Bi M.D.   On: 10/31/2014 10:36   Mr Abdomen Mrcp Wo Cm  11/01/2014   CLINICAL DATA:  Pancreatitis and common duct dilatation. Evaluate for choledocholithiasis.  EXAM: MRI ABDOMEN  WITHOUT CONTRAST  (INCLUDING MRCP)  TECHNIQUE: Multiplanar multisequence MR imaging of the abdomen was performed. Heavily T2-weighted images of the biliary and pancreatic ducts were obtained, and three-dimensional MRCP images were rendered by post processing.  COMPARISON:  CT of 1 day prior  FINDINGS: Portions of exam are mildly motion degraded.  Lower chest: Development of right greater than left bibasilar airspace disease. Mild cardiomegaly without significant pleural fluid.  Hepatobiliary: Normal liver. No intrahepatic ductal dilatation.Gallbladder distention is similar, mild.  The common duct is upper normal in size, measuring 7 millimeters in the porta hepatis on image 56 of series 6. Tapering to 6 millimeters in the pancreatic head on image 53 of series 6. Decreased since the CT of 1 day prior. There is a periampullary small duodenal diverticulum. No convincing evidence of choledocholithiasis, given motion.  Pancreas: Redemonstration of mild to moderate uncomplicated pancreatitis. No ductal dilatation.  Spleen: Normal  Adrenals/Urinary Tract: Normal adrenal glands. Normal kidneys, without hydronephrosis.  Stomach/Bowel: Normal stomach and abdominal bowel loops.  Vascular/Lymphatic: Normal caliber of the aorta and branch vessels. No abdominal adenopathy.  Other: Trace perisplenic ascites, similar.  Musculoskeletal: Minimal S-shaped thoracolumbar spine curvature.  IMPRESSION: 1. Mild-to-moderate pancreatitis. 2. Persistent gallbladder distension with decrease to resolution of common duct distension. Although there is mild motion artifact, no convincing evidence of choledocholithiasis is seen. Question  recent stone passage, given decreased in common duct size. 3. Development of bibasilar airspace disease. Given time course, favor atelectasis or aspiration. 4. Mild motion degradation.   Electronically Signed   By: Jeronimo Greaves M.D.   On: 11/01/2014 07:54   Mr 3d Recon At Scanner  11/01/2014   CLINICAL DATA:   Pancreatitis and common duct dilatation. Evaluate for choledocholithiasis.  EXAM: MRI ABDOMEN WITHOUT CONTRAST  (INCLUDING MRCP)  TECHNIQUE: Multiplanar multisequence MR imaging of the abdomen was performed. Heavily T2-weighted images of the biliary and pancreatic ducts were obtained, and three-dimensional MRCP images were rendered by post processing.  COMPARISON:  CT of 1 day prior  FINDINGS: Portions of exam are mildly motion degraded.  Lower chest: Development of right greater than left bibasilar airspace disease. Mild cardiomegaly without significant pleural fluid.  Hepatobiliary: Normal liver. No intrahepatic ductal dilatation.Gallbladder distention is similar, mild.  The common duct is upper normal in size, measuring 7 millimeters in the porta hepatis on image 56 of series 6. Tapering to 6 millimeters in the pancreatic head on image 53 of series 6. Decreased since the CT of 1 day prior. There is a periampullary small duodenal diverticulum. No convincing evidence of choledocholithiasis, given motion.  Pancreas: Redemonstration of mild to moderate uncomplicated pancreatitis. No ductal dilatation.  Spleen: Normal  Adrenals/Urinary Tract: Normal adrenal glands. Normal kidneys, without hydronephrosis.  Stomach/Bowel: Normal stomach and abdominal bowel loops.  Vascular/Lymphatic: Normal caliber of the aorta and branch vessels. No abdominal adenopathy.  Other: Trace perisplenic ascites, similar.  Musculoskeletal: Minimal S-shaped thoracolumbar spine curvature.  IMPRESSION: 1. Mild-to-moderate pancreatitis. 2. Persistent gallbladder distension with decrease to resolution of common duct distension. Although there is mild motion artifact, no convincing evidence of choledocholithiasis is seen. Question recent stone passage, given decreased in common duct size. 3. Development of bibasilar airspace disease. Given time course, favor atelectasis or aspiration. 4. Mild motion degradation.   Electronically Signed   By: Jeronimo Greaves M.D.   On: 11/01/2014 07:54    Scheduled Meds: . enoxaparin (LOVENOX) injection  40 mg Subcutaneous Q24H  . piperacillin-tazobactam (ZOSYN)  IV  3.375 g Intravenous Q8H   Continuous Infusions: . 0.9 % NaCl with KCl 40 mEq / L 125 mL/hr (11/01/14 0227)    Principal Problem:   Acute gallstone pancreatitis Active Problems:   Abdominal pain   Hypokalemia   Tobacco use disorder   Gallstone pancreatitis   Atelectasis    Time spent: 35 mintues    [facility] M  [facility] Pager [phone]. If 7PM-7AM, please contact night-coverage at [email or web address], password [email or web address] 11/01/2014, 10:39 AM  LOS: 1 day

## 2014-11-02 LAB — CBC
HCT: 35.6 % — ABNORMAL LOW (ref 39.0–52.0)
Hemoglobin: 12.1 g/dL — ABNORMAL LOW (ref 13.0–17.0)
MCH: 31.3 pg (ref 26.0–34.0)
MCHC: 34 g/dL (ref 30.0–36.0)
MCV: 92 fL (ref 78.0–100.0)
PLATELETS: 110 10*3/uL — AB (ref 150–400)
RBC: 3.87 MIL/uL — ABNORMAL LOW (ref 4.22–5.81)
RDW: 13 % (ref 11.5–15.5)
WBC: 7.3 10*3/uL (ref 4.0–10.5)

## 2014-11-02 LAB — BASIC METABOLIC PANEL
Anion gap: 5 (ref 5–15)
BUN: 9 mg/dL (ref 6–23)
CHLORIDE: 111 mmol/L (ref 96–112)
CO2: 21 mmol/L (ref 19–32)
Calcium: 8.3 mg/dL — ABNORMAL LOW (ref 8.4–10.5)
Creatinine, Ser: 0.64 mg/dL (ref 0.50–1.35)
GFR calc non Af Amer: >90 mL/min (ref >90)
Glucose, Bld: 78 mg/dL (ref 70–99)
Potassium: 3.9 mmol/L (ref 3.5–5.1)
Sodium: 137 mmol/L (ref 135–145)

## 2014-11-02 LAB — LIPASE, BLOOD: Lipase: 455 U/L — ABNORMAL HIGH (ref 11–59)

## 2014-11-02 MED ORDER — OXYCODONE-ACETAMINOPHEN 5-325 MG PO TABS: 1.0000 | ORAL_TABLET | Freq: Four times a day (QID) | ORAL | Status: DC | PRN

## 2014-11-02 MED ORDER — AMOXICILLIN-POT CLAVULANATE 875-125 MG PO TABS: 1.0000 | ORAL_TABLET | Freq: Two times a day (BID) | ORAL | Status: DC

## 2014-11-02 NOTE — Progress Notes (Signed)
Laren EvertsLarry W Spurling discharged home with wife per MD order.  Discharge instructions reviewed and discussed with the patient and wife at the bedside, all questions and concerns answered. Copy of instructions and scripts given to patient.    Medication List    TAKE these medications        ALLERGY RELIEF PO  Take 1 tablet by mouth 2 (two) times daily as needed (cold).     amoxicillin-clavulanate 875-125 MG per tablet  Commonly known as:  AUGMENTIN  Take 1 tablet by mouth 2 (two) times daily.     oxyCODONE-acetaminophen 5-325 MG per tablet  Commonly known as:  PERCOCET/ROXICET  Take 1 tablet by mouth every 6 (six) hours as needed for moderate pain.        Patients skin is clean, dry and intact, no evidence of skin break down. IV site discontinued and catheter remains intact. Site without signs and symptoms of complications. Dressing and pressure applied.  Patient escorted to car by Ladona Ridgelaylor, RN in a wheelchair,  no distress noted upon discharge.  Ubaldo GlassingJames, Adit Riddles Morgan 11/02/2014 6:24 PM

## 2014-11-02 NOTE — Progress Notes (Signed)
Subjective: Less abdominal pain noted by patient. Patient still states he is more short of breath than usual. He thinks this is due to a head cold.  Objective: Vital signs in last 24 hours: Temp:  [98.2 F (36.8 C)-99.3 F (37.4 C)] 99 F (37.2 C) (03/01 0543) Pulse Rate:  [84-92] 84 (03/01 0543) Resp:  [20] 20 (03/01 0543) BP: (121-145)/(75-89) 145/89 mmHg (03/01 0543) SpO2:  [94 %-96 %] 96 % (03/01 0543) Last BM Date: 11/01/14  Intake/Output from previous day: 02/29 0701 - 03/01 0700 In: 3819.2 [P.O.:840; I.V.:2829.2; IV Piggyback:150] Out: 2100 [Urine:2100] Intake/Output this shift:    General appearance: alert, cooperative and no distress Resp: No significant wheezing noted. Not splinting with inspiration like yesterday. GI: Soft, nontender, nondistended. No rigidity noted.  Lab Results:   Recent Labs  11/01/14 0646 11/02/14 0617  WBC 11.1* 7.3  HGB 13.8 12.1*  HCT 39.9 35.6*  PLT 121* 110*   BMET  Recent Labs  11/01/14 0646 11/02/14 0617  NA 142 137  K 3.9 3.9  CL 109 111  CO2 25 21  GLUCOSE 71 78  BUN 14 9  CREATININE 0.83 0.64  CALCIUM 8.4 8.3*   PT/INR No results for input(s): LABPROT, INR in the last 72 hours.  Studies/Results: Ct Abdomen Pelvis W Contrast  10/31/2014   CLINICAL DATA:  Acute onset abdominal pain.  Right-sided pain.  EXAM: CT ABDOMEN AND PELVIS WITH CONTRAST  TECHNIQUE: Multidetector CT imaging of the abdomen and pelvis was performed using the standard protocol following bolus administration of intravenous contrast.  CONTRAST:  50mL OMNIPAQUE IOHEXOL 300 MG/ML SOLN, 100mL OMNIPAQUE IOHEXOL 300 MG/ML SOLN  COMPARISON:  None.  FINDINGS: Lower chest: Lung base stones demonstrate mild atelectasis and interstitial edema.  Hepatobiliary: No focal hepatic lesion. There is mild periportal edema. Small amount of pericholecystic fluid. The gallbladder is mildly distended to 45 mm. The common bile duct is dilated the pancreatic head to 9 mm.  No obstructing lesion identified. No pancreatic duct dilatation.  Pancreas: There is extensive peripancreatic fluid extending along the left and right anterior perirenal fascia but greater on the left (image 43, series 2). There is edema and fluid at the tail. No organized fluid collections. The pancreatic parenchyma is mildly hypo attenuating. No organized fluid collections. No pancreatic duct dilatation.  Spleen: Normal spleen.  Adrenals/urinary tract: Adrenal glands are normal. The kidneys, ureters, and bladder are normal. There is a fat density lesion within the cortex of the right kidney measuring 16 mm (image 45, series 2). 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  Stomach/Bowel: The stomach, duodenum, small bowel, appendix, cecum normal. The colon and rectosigmoid colon are normal.  Vascular/Lymphatic: Abdominal or is normal caliber. No vascular complication. Portal veins are patent. Splenic vein is patent.  Reproductive: Prostate gland is normal.  Musculoskeletal: No aggressive osseous lesion. There are multiple small sclerotic lesions within the bones of the pelvis, sacrum, and spine. These likely represent benign bone islands but are indeterminate.  IMPRESSION:  1. Acute pancreatitis.  No organized fluid collection. 2. Dilatation of the common bile duct and distension of gallbladder. Concern for gallstone pancreatitis. No gallstones are evident by CT. Consider ultrasound or MRCP evaluation for non radiodense stones. 3. Multiple small sclerotic lesions throughout the pelvis and spine. 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   Electronically Signed   By: Genevive BiStewart  Edmunds M.D.   On: 10/31/2014 10:36  Mr Abdomen Mrcp Wo Cm  11/01/2014   CLINICAL DATA:  Pancreatitis and common duct dilatation. Evaluate for choledocholithiasis.  EXAM: MRI ABDOMEN WITHOUT CONTRAST  (INCLUDING MRCP)  TECHNIQUE:  Multiplanar multisequence MR imaging of the abdomen was performed. Heavily T2-weighted images of the biliary and pancreatic ducts were obtained, and three-dimensional MRCP images were rendered by post processing.  COMPARISON:  CT of 1 day prior  FINDINGS: Portions of exam are mildly motion degraded.  Lower chest: Development of right greater than left bibasilar airspace disease. Mild cardiomegaly without significant pleural fluid.  Hepatobiliary: Normal liver. No intrahepatic ductal dilatation.Gallbladder distention is similar, mild.  The common duct is upper normal in size, measuring 7 millimeters in the porta hepatis on image 56 of series 6. Tapering to 6 millimeters in the pancreatic head on image 53 of series 6. Decreased since the CT of 1 day prior. There is a periampullary small duodenal diverticulum. No convincing evidence of choledocholithiasis, given motion.  Pancreas: Redemonstration of mild to moderate uncomplicated pancreatitis. No ductal dilatation.  Spleen: Normal  Adrenals/Urinary Tract: Normal adrenal glands. Normal kidneys, without hydronephrosis.  Stomach/Bowel: Normal stomach and abdominal bowel loops.  Vascular/Lymphatic: Normal caliber of the aorta and branch vessels. No abdominal adenopathy.  Other: Trace perisplenic ascites, similar.  Musculoskeletal: Minimal S-shaped thoracolumbar spine curvature.  IMPRESSION: 1. Mild-to-moderate pancreatitis. 2. Persistent gallbladder distension with decrease to resolution of common duct distension. Although there is mild motion artifact, no convincing evidence of choledocholithiasis is seen. Question recent stone passage, given decreased in common duct size. 3. Development of bibasilar airspace disease. Given time course, favor atelectasis or aspiration. 4. Mild motion degradation.   Electronically Signed   By: Jeronimo Greaves M.D.   On: 11/01/2014 07:54   Mr 3d Recon At Scanner  11/01/2014   CLINICAL DATA:  Pancreatitis and common duct dilatation. Evaluate  for choledocholithiasis.  EXAM: MRI ABDOMEN WITHOUT CONTRAST  (INCLUDING MRCP)  TECHNIQUE: Multiplanar multisequence MR imaging of the abdomen was performed. Heavily T2-weighted images of the biliary and pancreatic ducts were obtained, and three-dimensional MRCP images were rendered by post processing.  COMPARISON:  CT of 1 day prior  FINDINGS: Portions of exam are mildly motion degraded.  Lower chest: Development of right greater than left bibasilar airspace disease. Mild cardiomegaly without significant pleural fluid.  Hepatobiliary: Normal liver. No intrahepatic ductal dilatation.Gallbladder distention is similar, mild.  The common duct is upper normal in size, measuring 7 millimeters in the porta hepatis on image 56 of series 6. Tapering to 6 millimeters in the pancreatic head on image 53 of series 6. Decreased since the CT of 1 day prior. There is a periampullary small duodenal diverticulum. No convincing evidence of choledocholithiasis, given motion.  Pancreas: Redemonstration of mild to moderate uncomplicated pancreatitis. No ductal dilatation.  Spleen: Normal  Adrenals/Urinary Tract: Normal adrenal glands. Normal kidneys, without hydronephrosis.  Stomach/Bowel: Normal stomach and abdominal bowel loops.  Vascular/Lymphatic: Normal caliber of the aorta and branch vessels. No abdominal adenopathy.  Other: Trace perisplenic ascites, similar.  Musculoskeletal: Minimal S-shaped thoracolumbar spine curvature.  IMPRESSION: 1. Mild-to-moderate pancreatitis. 2. Persistent gallbladder distension with decrease to resolution of common duct distension. Although there is mild motion artifact, no convincing evidence of choledocholithiasis is seen. Question recent stone passage, given decreased in common duct size. 3. Development of bibasilar airspace disease. Given time course, favor atelectasis or aspiration. 4. Mild motion degradation.   Electronically Signed   By: Jeronimo Greaves M.D.   On: 11/01/2014 07:54  Anti-infectives: Anti-infectives    Start     Dose/Rate Route Frequency Ordered Stop   10/31/14 2100  piperacillin-tazobactam (ZOSYN) IVPB 3.375 g     3.375 g 12.5 mL/hr over 240 Minutes Intravenous Every 8 hours 10/31/14 1441     10/31/14 1115  piperacillin-tazobactam (ZOSYN) IVPB 3.375 g     3.375 g 100 mL/hr over 30 Minutes Intravenous  Once 10/31/14 1109 10/31/14 1241      Assessment/Plan: Impression: Gallstone pancreatitis, resolving Plan: Given his upper respiratory issues, would prefer to perform cholecystectomy as an outpatient. May advance diet as tolerated.  LOS: 2 days    Marlia Schewe A 11/02/2014

## 2014-11-02 NOTE — Discharge Summary (Signed)
Physician Discharge Summary  Mike Shelton ZOX:[id number] DOB: 02-Nov-1955 DOA: 10/31/2014  PCP: No primary care provider on file.  Admit date: 10/31/2014 Discharge date: 11/02/2014  Time spent: 40 minutes  Recommendations for Outpatient Follow-up:  1. Dr Lovell Sheehan office will contact for evaluation of OP surgery, follow cbc 2. Advance diet slowly as tolerated  Discharge Diagnoses:  Principal Problem:   Acute gallstone pancreatitis Active Problems:   Abdominal pain   Hypokalemia   Tobacco use disorder   Gallstone pancreatitis   Atelectasis   Discharge Condition: stable  Diet recommendation: full liquids to advance as tolerated  Filed Weights   10/31/14 0742 10/31/14 1245  Weight: 76.658 kg (169 lb) 73.619 kg (162 lb 4.8 oz)    History of present illness:  Mike Shelton is a 59 y.o. male who presented to the hospital on 10/31/14 with abdominal pain. She described it as lower abdominal pain which began night prior. He reported similar episodes in the past, but these resolved on their own. His pain began night prior and was continuous.Marland Kitchen He did have some episodes of vomiting. He had not had any diarrhea, no fever, no chest pain, shortness of breath, no dysuria. He does not drink alcohol. He was evaluated in the emergency room with CT imaging indicated pancreatitis with possible gallstone etiology.    Hospital Course:   Acute gallstone pancreatitis. Patient had CT imaging as well as biochemical markers consistent with gallstone pancreatitis.Serum lipase >3000 on admission and 455 at discharge.Liver enzymes with AST 213 on admission and 77 at discharge, ALT 354 on admission and 212 at discharge.  MRCP with mild to moderate pancreatitis and persistent gallbladder distention and no convincing evidence of stone which may have passed. Appreciate General surgery input. He received 3 days zosyn. At discharge he is afebrile and no leukocytosis. Abdominal pain well managed and tolerating full liquid  diet. Follow up with Dr Lovell Sheehan 1 week for OP surgery  Hypokalemia. Resolved at discharge.   Tobacco use. Counseled on the importance of tobacco cessation. Some atelectasis noted on imaging. He does have Moist non-productive cough. Instructed on  IS. Oxygen saturation level 96% on room air.   Thrombocytopenia. No prior labs for comparison.  May be reactive to acute episode. Continue to follow.  Atelectasis: per imaging. Smoker. Moist non-productive cough. Improved cough effort at discharge. IS   Procedures:  MRCP as below  Consultations:  Dr Lovell Sheehan  Discharge Exam: Filed Vitals:   11/02/14 0543  BP: 145/89  Pulse: 84  Temp: 99 F (37.2 C)  Resp: 20    General: well nourished appears comfortable Cardiovascular: RRR no MGR No LE edema Respiratory: normal effort BS somewhat diminished bilateral bases and slightly coarse.   Discharge Instructions    Current Discharge Medication List    START taking these medications   Details  oxyCODONE-acetaminophen (PERCOCET/ROXICET) 5-325 MG per tablet Take 1 tablet by mouth every 6 (six) hours as needed for moderate pain. Qty: 30 tablet, Refills: 0      CONTINUE these medications which have NOT CHANGED   Details  Chlorpheniramine Maleate (ALLERGY RELIEF PO) Take 1 tablet by mouth 2 (two) times daily as needed (cold).       Allergies  Allergen Reactions  . Other     Unknown antibiotic for strep throat.   Follow-up Information    Follow up with Dalia Heading, MD. Schedule an appointment as soon as possible for a visit in 1 week.   Specialty:  General Surgery  Why:  call for evaluation for OP surgery   Contact information:   [address] [facility] [address] [phone]        The results of significant diagnostics from this hospitalization (including imaging, microbiology, ancillary and laboratory) are listed below for reference.    Significant Diagnostic Studies: Ct Abdomen Pelvis W  Contrast  10/31/2014   CLINICAL DATA:  Acute onset abdominal pain.  Right-sided pain.  EXAM: CT ABDOMEN AND PELVIS WITH CONTRAST  TECHNIQUE: Multidetector CT imaging of the abdomen and pelvis was performed using the standard protocol following bolus administration of intravenous contrast.  CONTRAST:  50mL OMNIPAQUE IOHEXOL 300 MG/ML SOLN, OMNIPAQUE IOHEXOL 300 MG/ML SOLN  COMPARISON:  None.  FINDINGS: Lower chest: Lung base stones demonstrate mild atelectasis and interstitial edema.  Hepatobiliary: No focal hepatic lesion. There is mild periportal edema. Small amount of pericholecystic fluid. The gallbladder is mildly distended to 45 mm. The common bile duct is dilated the pancreatic head to 9 mm. No obstructing lesion identified. No pancreatic duct dilatation.  Pancreas: There is extensive peripancreatic fluid extending along the left and right anterior perirenal fascia but greater on the left (image 43, series 2). There is edema and fluid at the tail. No organized fluid collections. The pancreatic parenchyma is mildly hypo attenuating. No organized fluid collections. No pancreatic duct dilatation.  Spleen: Normal spleen.  Adrenals/urinary tract: Adrenal glands are normal. The kidneys, ureters, and bladder are normal. There is a fat density lesion within the cortex of the right kidney measuring 16 mm (image 45, series 2). 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  Stomach/Bowel: The stomach, duodenum, small bowel, appendix, cecum normal. The colon and rectosigmoid colon are normal.  Vascular/Lymphatic: Abdominal or is normal caliber. No vascular complication. Portal veins are patent. Splenic vein is patent.  Reproductive: Prostate gland is normal.  Musculoskeletal: No aggressive osseous lesion. There are multiple small sclerotic lesions within the bones of the pelvis, sacrum, and spine. These likely represent benign bone islands but are indeterminate.  IMPRESSION:  1. Acute pancreatitis.  No organized  fluid collection. 2. Dilatation of the common bile duct and distension of gallbladder. Concern for gallstone pancreatitis. No gallstones are evident by CT. Consider ultrasound or MRCP evaluation for non radiodense stones. 3. Multiple small sclerotic lesions throughout the pelvis and spine. 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   Electronically Signed   By: Genevive Bi M.D.   On: 10/31/2014 10:36   Mr Abdomen Mrcp Wo Cm  11/01/2014   CLINICAL DATA:  Pancreatitis and common duct dilatation. Evaluate for choledocholithiasis.  EXAM: MRI ABDOMEN WITHOUT CONTRAST  (INCLUDING MRCP)  TECHNIQUE: Multiplanar multisequence MR imaging of the abdomen was performed. Heavily T2-weighted images of the biliary and pancreatic ducts were obtained, and three-dimensional MRCP images were rendered by post processing.  COMPARISON:  CT of 1 day prior  FINDINGS: Portions of exam are mildly motion degraded.  Lower chest: Development of right greater than left bibasilar airspace disease. Mild cardiomegaly without significant pleural fluid.  Hepatobiliary: Normal liver. No intrahepatic ductal dilatation.Gallbladder distention is similar, mild.  The common duct is upper normal in size, measuring 7 millimeters in the porta hepatis on image 56 of series 6. Tapering to 6 millimeters in the pancreatic head on image 53 of series 6. Decreased since the CT of 1 day prior. There is a periampullary small duodenal diverticulum. No convincing evidence of choledocholithiasis, given motion.  Pancreas: Redemonstration of mild to moderate uncomplicated pancreatitis. No ductal dilatation.  Spleen: Normal  Adrenals/Urinary Tract: Normal adrenal glands. Normal kidneys, without hydronephrosis.  Stomach/Bowel: Normal stomach and abdominal bowel loops.  Vascular/Lymphatic: Normal caliber of the aorta and branch vessels. No abdominal adenopathy.  Other:  Trace perisplenic ascites, similar.  Musculoskeletal: Minimal S-shaped thoracolumbar spine curvature.  IMPRESSION: 1. Mild-to-moderate pancreatitis. 2. Persistent gallbladder distension with decrease to resolution of common duct distension. Although there is mild motion artifact, no convincing evidence of choledocholithiasis is seen. Question recent stone passage, given decreased in common duct size. 3. Development of bibasilar airspace disease. Given time course, favor atelectasis or aspiration. 4. Mild motion degradation.   Electronically Signed   By: Jeronimo Greaves M.D.   On: 11/01/2014 07:54   Mr 3d Recon At Scanner  11/01/2014   CLINICAL DATA:  Pancreatitis and common duct dilatation. Evaluate for choledocholithiasis.  EXAM: MRI ABDOMEN WITHOUT CONTRAST  (INCLUDING MRCP)  TECHNIQUE: Multiplanar multisequence MR imaging of the abdomen was performed. Heavily T2-weighted images of the biliary and pancreatic ducts were obtained, and three-dimensional MRCP images were rendered by post processing.  COMPARISON:  CT of 1 day prior  FINDINGS: Portions of exam are mildly motion degraded.  Lower chest: Development of right greater than left bibasilar airspace disease. Mild cardiomegaly without significant pleural fluid.  Hepatobiliary: Normal liver. No intrahepatic ductal dilatation.Gallbladder distention is similar, mild.  The common duct is upper normal in size, measuring 7 millimeters in the porta hepatis on image 56 of series 6. Tapering to 6 millimeters in the pancreatic head on image 53 of series 6. Decreased since the CT of 1 day prior. There is a periampullary small duodenal diverticulum. No convincing evidence of choledocholithiasis, given motion.  Pancreas: Redemonstration of mild to moderate uncomplicated pancreatitis. No ductal dilatation.  Spleen: Normal  Adrenals/Urinary Tract: Normal adrenal glands. Normal kidneys, without hydronephrosis.  Stomach/Bowel: Normal stomach and abdominal bowel loops.   Vascular/Lymphatic: Normal caliber of the aorta and branch vessels. No abdominal adenopathy.  Other: Trace perisplenic ascites, similar.  Musculoskeletal: Minimal S-shaped thoracolumbar spine curvature.  IMPRESSION: 1. Mild-to-moderate pancreatitis. 2. Persistent gallbladder distension with decrease to resolution of common duct distension. Although there is mild motion artifact, no convincing evidence of choledocholithiasis is seen. Question recent stone passage, given decreased in common duct size. 3. Development of bibasilar airspace disease. Given time course, favor atelectasis or aspiration. 4. Mild motion degradation.   Electronically Signed   By: Jeronimo Greaves M.D.   On: 11/01/2014 07:54    Microbiology: No results found for this or any previous visit (from the past 240 hour(s)).   Labs: Basic Metabolic Panel:  Recent Labs Lab 10/31/14 0821 11/01/14 0646 11/02/14 0617  NA 136 142 137  K 3.2* 3.9 3.9  CL 107 109 111  CO2 GLUCOSE 134* 71 78  BUN CREATININE 0.88 0.83 0.64  CALCIUM 8.4 8.4 8.3*   Liver Function Tests:  Recent Labs Lab 10/31/14 0821 11/01/14 0646  AST 213* 77*  ALT 354* 212*  ALKPHOS 175* 128*  BILITOT 3.9* 2.2*  PROT 6.6 5.8*  ALBUMIN 3.5 3.0*    Recent Labs Lab 10/31/14 0821 11/01/14 0646 11/02/14 0617  LIPASE >3000* 1146* 455*   No results for input(s): AMMONIA in the last 168 hours. CBC:  Recent Labs Lab 10/31/14 0821 11/01/14 0646 11/02/14 0617  WBC 13.2* 11.1* 7.3  NEUTROABS 11.8*  --   --   HGB 14.8  13.8 12.1*  HCT 42.6 39.9 35.6*  MCV 91.6 92.8 92.0  PLT 119* 121* 110*   Cardiac Enzymes: No results for input(s): CKTOTAL, CKMB, CKMBINDEX, TROPONINI in the last 168 hours. BNP: BNP (last 3 results) No results for input(s): BNP in the last 8760 hours.  ProBNP (last 3 results) No results for input(s): PROBNP in the last 8760 hours.  CBG: No results for input(s): GLUCAP in the last 168  hours.     SignedGwenyth Bender:  Chika Cichowski M  [facility] 11/02/2014, 1:57 PM

## 2014-11-10 NOTE — Patient Instructions (Signed)
Laren EvertsLarry W Wynia  11/10/2014   Your procedure is scheduled on:  11/12/2014  [facility]  [facility].  Call this number if you have problems the morning of surgery: [phone]   Remember:   Do not eat food or drink liquids after midnight.   Take these medicines the morning of surgery with A SIP OF WATER: oxycodone   Do not wear jewelry, make-up or nail polish.  Do not wear lotions, powders, or perfumes.   Do not shave 48 hours prior to surgery. Men may shave face and neck.  Do not bring valuables to the hospital.  [facility] is not responsible for any belongings or valuables.               Contacts, dentures or bridgework may not be worn into surgery.  Leave suitcase in the car. After surgery it may be brought to your room.  For patients admitted to the hospital, discharge time is determined by your treatment team.               Patients discharged the day of surgery will not be allowed to drive home.  Name and phone number of your driver: family  Special Instructions: Shower using CHG 2 nights before surgery and the night before surgery.  If you shower the day of surgery use CHG.  Use special wash - you have one bottle of CHG for all showers.  You should use approximately 1/3 of the bottle for each shower.   Please read over the following fact sheets that you were given: Pain Booklet, Coughing and Deep Breathing, Surgical Site Infection Prevention, Anesthesia Post-op Instructions and Care and Recovery After Surgery Laparoscopic Cholecystectomy Laparoscopic cholecystectomy is surgery to remove the gallbladder. The gallbladder is located in the upper right part of the abdomen, behind the liver. It is a storage sac for bile produced in the liver. Bile aids in the digestion and absorption of fats. Cholecystectomy is often done for inflammation of the gallbladder (cholecystitis). This condition is usually caused by a buildup of gallstones (cholelithiasis) in your gallbladder.  Gallstones can block the flow of bile, resulting in inflammation and pain. In severe cases, emergency surgery may be required. When emergency surgery is not required, you will have time to prepare for the procedure. Laparoscopic surgery is an alternative to open surgery. Laparoscopic surgery has a shorter recovery time. Your common bile duct may also need to be examined during the procedure. If stones are found in the common bile duct, they may be removed. [facility] KNOW ABOUT:  Any allergies you have.  All medicines you are taking, including vitamins, herbs, eye drops, creams, and over-the-counter medicines.  Previous problems you or members of your family have had with the use of anesthetics.  Any blood disorders you have.  Previous surgeries you have had.  Medical conditions you have. RISKS AND COMPLICATIONS Generally, this is a safe procedure. However, as with any procedure, complications can occur. Possible complications include:  Infection.  Damage to the common bile duct, nerves, arteries, veins, or other internal organs such as the stomach, liver, or intestines.  Bleeding.  A stone may remain in the common bile duct.  A bile leak from the cyst duct that is clipped when your gallbladder is removed.  The need to convert to open surgery, which requires a larger incision in the abdomen. This may be necessary if your surgeon thinks it is not  safe to continue with a laparoscopic procedure. BEFORE THE PROCEDURE  Ask your health care provider about changing or stopping any regular medicines. You will need to stop taking aspirin or blood thinners at least 5 days prior to surgery.  Do not eat or drink anything after midnight the night before surgery.  Let your health care provider know if you develop a cold or other infectious problem before surgery. PROCEDURE   You will be given medicine to make you sleep through the procedure (general anesthetic). A breathing  tube will be placed in your mouth.  When you are asleep, your surgeon will make several small cuts (incisions) in your abdomen.  A thin, lighted tube with a tiny camera on the end (laparoscope) is inserted through one of the small incisions. The camera on the laparoscope sends a picture to a TV screen in the operating room. This gives the surgeon a good view inside your abdomen.  A gas will be pumped into your abdomen. This expands your abdomen so that the surgeon has more room to perform the surgery.  Other tools needed for the procedure are inserted through the other incisions. The gallbladder is removed through one of the incisions.  After the removal of your gallbladder, the incisions will be closed with stitches, staples, or skin glue. AFTER THE PROCEDURE  You will be taken to a recovery area where your progress will be checked often.  You may be allowed to go home the same day if your pain is controlled and you can tolerate liquids. Document Released: 08/20/2005 Document Revised: 06/10/2013 Document Reviewed: 04/01/2013 [facility] Patient Information 2015 [place], [place]. This information is not intended to replace advice given to you by your health care provider. Make sure you discuss any questions you have with your health care provider. PATIENT INSTRUCTIONS POST-ANESTHESIA  IMMEDIATELY FOLLOWING SURGERY:  Do not drive or operate machinery for the first twenty four hours after surgery.  Do not make any important decisions for twenty four hours after surgery or while taking narcotic pain medications or sedatives.  If you develop intractable nausea and vomiting or a severe headache please notify your doctor immediately.  FOLLOW-UP:  Please make an appointment with your surgeon as instructed. You do not need to follow up with anesthesia unless specifically instructed to do so.  WOUND CARE INSTRUCTIONS (if applicable):  Keep a dry clean dressing on the anesthesia/puncture wound site if  there is drainage.  Once the wound has quit draining you may leave it open to air.  Generally you should leave the bandage intact for twenty four hours unless there is drainage.  If the epidural site drains for more than 36-48 hours please call the anesthesia department.  QUESTIONS?:  Please feel free to call your physician or the hospital operator if you have any questions, and they will be happy to assist you.

## 2014-11-10 NOTE — H&P (Signed)
Mike EvertsLarry W Shelton is an 59 y.o. male.   Chief Complaint: Gallstone pancreatitis HPI: Patient is a 59-year-old white male who was recently admitted with gallstone pancreatitis. He had an upper respiratory infection which has since resolved. He now presents for an elective laparoscopic cholecystectomy.  No past medical history on file.  Past Surgical History  Procedure Laterality Date  . Knee surgery      No family history on file. Social History:  reports that he has been smoking.  He has never used smokeless tobacco. He reports that he does not drink alcohol or use illicit drugs.  Allergies:  Allergies  Allergen Reactions  . Other     Unknown antibiotic for strep throat.    No prescriptions prior to admission    No results found for this or any previous visit (from the past 48 hour(s)). No results found.  Review of Systems  Constitutional: Negative.   Respiratory: Positive for cough. Negative for shortness of breath and wheezing.   Cardiovascular: Negative.   Gastrointestinal: Negative.   Genitourinary: Negative.   All other systems reviewed and are negative.   There were no vitals taken for this visit. Physical Exam  Constitutional: He is oriented to person, place, and time. He appears well-developed and well-nourished.  HENT:  Head: Normocephalic and atraumatic.  Neck: Normal range of motion. Neck supple.  Cardiovascular: Normal rate, regular rhythm and normal heart sounds.   Respiratory: Effort normal and breath sounds normal.  GI: Soft. Bowel sounds are normal. He exhibits no distension. There is no tenderness. There is no rebound.  Neurological: He is alert and oriented to person, place, and time.  Skin: Skin is warm and dry.     Assessment/Plan Impression: Gallstone pancreatitis Plan: Patient will undergo a laparoscopic cholecystectomy on 11/12/2014. The risks and benefits of the procedure including bleeding, infection, hepatobiliary injury, the possibility of an  open procedure were fully explained to the patient, who gave informed consent.  Mike Shelton A 11/10/2014, 10:16 AM

## 2014-11-11 ENCOUNTER — Encounter (HOSPITAL_COMMUNITY)
Admission: RE | Admit: 2014-11-11 | Discharge: 2014-11-11 | Disposition: A | Discharge status: Home / Self Care | Payer: BLUE CROSS/BLUE SHIELD | Source: Ambulatory Visit | Attending: Orthopedic Surgery | Admitting: Orthopedic Surgery

## 2014-11-11 ENCOUNTER — Other Ambulatory Visit: Payer: Self-pay

## 2014-11-11 ENCOUNTER — Encounter (HOSPITAL_COMMUNITY): Payer: Self-pay

## 2014-11-11 DIAGNOSIS — Z888 Allergy status to other drugs, medicaments and biological substances status: Secondary | ICD-10-CM | POA: Diagnosis not present

## 2014-11-11 DIAGNOSIS — K851 Biliary acute pancreatitis: Secondary | ICD-10-CM | POA: Diagnosis not present

## 2014-11-11 DIAGNOSIS — K8020 Calculus of gallbladder without cholecystitis without obstruction: Secondary | ICD-10-CM | POA: Diagnosis present

## 2014-11-11 LAB — LIPASE, BLOOD: LIPASE: 170 U/L — AB (ref 11–59)

## 2014-11-11 LAB — CBC WITH DIFFERENTIAL/PLATELET
BASOS PCT: 1 % (ref 0–1)
Basophils Absolute: 0.1 10*3/uL (ref 0.0–0.1)
Eosinophils Absolute: 0.1 10*3/uL (ref 0.0–0.7)
Eosinophils Relative: 1 % (ref 0–5)
HEMATOCRIT: 42.2 % (ref 39.0–52.0)
Hemoglobin: 14.5 g/dL (ref 13.0–17.0)
LYMPHS ABS: 2.4 10*3/uL (ref 0.7–4.0)
LYMPHS PCT: 32 % (ref 12–46)
MCH: 31.7 pg (ref 26.0–34.0)
MCHC: 34.4 g/dL (ref 30.0–36.0)
MCV: 92.1 fL (ref 78.0–100.0)
MONO ABS: 0.4 10*3/uL (ref 0.1–1.0)
Monocytes Relative: 5 % (ref 3–12)
NEUTROS PCT: 61 % (ref 43–77)
Neutro Abs: 4.7 10*3/uL (ref 1.7–7.7)
PLATELETS: 310 10*3/uL (ref 150–400)
RBC: 4.58 MIL/uL (ref 4.22–5.81)
RDW: 12.7 % (ref 11.5–15.5)
WBC: 7.6 10*3/uL (ref 4.0–10.5)

## 2014-11-11 LAB — BASIC METABOLIC PANEL
ANION GAP: 7 (ref 5–15)
BUN: 11 mg/dL (ref 6–23)
CO2: 25 mmol/L (ref 19–32)
Calcium: 9.6 mg/dL (ref 8.4–10.5)
Chloride: 106 mmol/L (ref 96–112)
Creatinine, Ser: 0.81 mg/dL (ref 0.50–1.35)
GFR calc Af Amer: >90 mL/min (ref >90)
GFR calc non Af Amer: >90 mL/min (ref >90)
GLUCOSE: 79 mg/dL (ref 70–99)
POTASSIUM: 4.1 mmol/L (ref 3.5–5.1)
Sodium: 138 mmol/L (ref 135–145)

## 2014-11-11 LAB — HEPATIC FUNCTION PANEL
ALT: 37 U/L (ref 0–53)
AST: 21 U/L (ref 0–37)
Albumin: 3.5 g/dL (ref 3.5–5.2)
Alkaline Phosphatase: 83 U/L (ref 39–117)
BILIRUBIN INDIRECT: 0.8 mg/dL (ref 0.3–0.9)
Bilirubin, Direct: 0.2 mg/dL (ref 0.0–0.5)
Total Bilirubin: 1 mg/dL (ref 0.3–1.2)
Total Protein: 6.7 g/dL (ref 6.0–8.3)

## 2014-11-12 ENCOUNTER — Ambulatory Visit (HOSPITAL_COMMUNITY): Payer: BLUE CROSS/BLUE SHIELD | Admitting: Certified Registered Nurse Anesthetist

## 2014-11-12 ENCOUNTER — Ambulatory Visit (HOSPITAL_COMMUNITY)
Admission: RE | Admit: 2014-11-12 | Discharge: 2014-11-12 | Disposition: A | Discharge status: Home / Self Care | Payer: BLUE CROSS/BLUE SHIELD | Source: Ambulatory Visit | Attending: General Surgery | Admitting: General Surgery

## 2014-11-12 ENCOUNTER — Encounter (HOSPITAL_COMMUNITY): Payer: Self-pay | Admitting: Certified Registered Nurse Anesthetist

## 2014-11-12 ENCOUNTER — Encounter (HOSPITAL_COMMUNITY)
Admission: RE | Disposition: A | Discharge status: Home / Self Care | Payer: Self-pay | Source: Ambulatory Visit | Attending: General Surgery

## 2014-11-12 DIAGNOSIS — K8020 Calculus of gallbladder without cholecystitis without obstruction: Secondary | ICD-10-CM | POA: Insufficient documentation

## 2014-11-12 DIAGNOSIS — K851 Biliary acute pancreatitis: Secondary | ICD-10-CM | POA: Insufficient documentation

## 2014-11-12 DIAGNOSIS — Z888 Allergy status to other drugs, medicaments and biological substances status: Secondary | ICD-10-CM | POA: Insufficient documentation

## 2014-11-12 HISTORY — PX: CHOLECYSTECTOMY: SHX55

## 2014-11-12 SURGERY — LAPAROSCOPIC CHOLECYSTECTOMY
Anesthesia: General

## 2014-11-12 MED ORDER — MIDAZOLAM HCL 2 MG/2ML IJ SOLN
1.0000 mg | INTRAMUSCULAR | Status: DC | PRN
  Administered 2014-11-12: 2 mg via INTRAVENOUS

## 2014-11-12 MED ORDER — DEXAMETHASONE SODIUM PHOSPHATE 4 MG/ML IJ SOLN
INTRAMUSCULAR | Status: AC
  Filled 2014-11-12: qty 1

## 2014-11-12 MED ORDER — KETOROLAC TROMETHAMINE 30 MG/ML IJ SOLN
30.0000 mg | Freq: Once | INTRAMUSCULAR | Status: AC
  Administered 2014-11-12: 30 mg via INTRAVENOUS
  Filled 2014-11-12: qty 1

## 2014-11-12 MED ORDER — BUPIVACAINE HCL (PF) 0.5 % IJ SOLN
INTRAMUSCULAR | Status: DC | PRN
  Administered 2014-11-12: 10 mL

## 2014-11-12 MED ORDER — CIPROFLOXACIN IN D5W 400 MG/200ML IV SOLN
INTRAVENOUS | Status: DC | PRN
  Administered 2014-11-12: 400 mg via INTRAVENOUS

## 2014-11-12 MED ORDER — PROPOFOL 10 MG/ML IV BOLUS
INTRAVENOUS | Status: AC
  Filled 2014-11-12: qty 20

## 2014-11-12 MED ORDER — ONDANSETRON HCL 4 MG/2ML IJ SOLN: 4.0000 mg | Freq: Once | INTRAMUSCULAR | Status: DC | PRN

## 2014-11-12 MED ORDER — SODIUM CHLORIDE 0.9 % IR SOLN
Status: DC | PRN
  Administered 2014-11-12: 1000 mL

## 2014-11-12 MED ORDER — DEXAMETHASONE SODIUM PHOSPHATE 4 MG/ML IJ SOLN
4.0000 mg | Freq: Once | INTRAMUSCULAR | Status: AC
  Administered 2014-11-12: 4 mg via INTRAVENOUS

## 2014-11-12 MED ORDER — BUPIVACAINE HCL (PF) 0.5 % IJ SOLN
INTRAMUSCULAR | Status: AC
  Filled 2014-11-12: qty 30

## 2014-11-12 MED ORDER — GLYCOPYRROLATE 0.2 MG/ML IJ SOLN
INTRAMUSCULAR | Status: AC
  Filled 2014-11-12: qty 1

## 2014-11-12 MED ORDER — CIPROFLOXACIN IN D5W 400 MG/200ML IV SOLN: 400.0000 mg | INTRAVENOUS | Status: DC

## 2014-11-12 MED ORDER — LACTATED RINGERS IV SOLN
INTRAVENOUS | Status: DC
  Administered 2014-11-12: 1000 mL via INTRAVENOUS

## 2014-11-12 MED ORDER — ONDANSETRON HCL 4 MG/2ML IJ SOLN
INTRAMUSCULAR | Status: AC
  Filled 2014-11-12: qty 2

## 2014-11-12 MED ORDER — LIDOCAINE HCL (CARDIAC) 10 MG/ML IV SOLN
INTRAVENOUS | Status: DC | PRN
  Administered 2014-11-12: 100 mg via INTRAVENOUS

## 2014-11-12 MED ORDER — GLYCOPYRROLATE 0.2 MG/ML IJ SOLN
0.2000 mg | Freq: Once | INTRAMUSCULAR | Status: AC
  Administered 2014-11-12: 0.2 mg via INTRAVENOUS

## 2014-11-12 MED ORDER — FENTANYL CITRATE 0.05 MG/ML IJ SOLN
INTRAMUSCULAR | Status: DC | PRN
  Administered 2014-11-12: 100 ug via INTRAVENOUS
  Administered 2014-11-12 (×3): 50 ug via INTRAVENOUS

## 2014-11-12 MED ORDER — ONDANSETRON HCL 4 MG/2ML IJ SOLN
4.0000 mg | Freq: Once | INTRAMUSCULAR | Status: AC
  Administered 2014-11-12: 4 mg via INTRAVENOUS

## 2014-11-12 MED ORDER — FENTANYL CITRATE 0.05 MG/ML IJ SOLN: 25.0000 ug | INTRAMUSCULAR | Status: DC | PRN

## 2014-11-12 MED ORDER — GLYCOPYRROLATE 0.2 MG/ML IJ SOLN
INTRAMUSCULAR | Status: DC | PRN
  Administered 2014-11-12: 0.6 mg via INTRAVENOUS

## 2014-11-12 MED ORDER — POVIDONE-IODINE 10 % EX OINT
TOPICAL_OINTMENT | CUTANEOUS | Status: AC
  Filled 2014-11-12: qty 1

## 2014-11-12 MED ORDER — OXYCODONE-ACETAMINOPHEN 7.5-325 MG PO TABS: 1.0000 | ORAL_TABLET | ORAL | Status: AC | PRN

## 2014-11-12 MED ORDER — LIDOCAINE HCL (PF) 1 % IJ SOLN
INTRAMUSCULAR | Status: AC
  Filled 2014-11-12: qty 10

## 2014-11-12 MED ORDER — GLYCOPYRROLATE 0.2 MG/ML IJ SOLN
INTRAMUSCULAR | Status: AC
  Filled 2014-11-12: qty 3

## 2014-11-12 MED ORDER — NEOSTIGMINE METHYLSULFATE 10 MG/10ML IV SOLN
INTRAVENOUS | Status: AC
  Filled 2014-11-12: qty 1

## 2014-11-12 MED ORDER — HEMOSTATIC AGENTS (NO CHARGE) OPTIME
TOPICAL | Status: DC | PRN
  Administered 2014-11-12: 1 via TOPICAL

## 2014-11-12 MED ORDER — NEOSTIGMINE METHYLSULFATE 10 MG/10ML IV SOLN
INTRAVENOUS | Status: DC | PRN
  Administered 2014-11-12: 4 mg via INTRAVENOUS

## 2014-11-12 MED ORDER — MIDAZOLAM HCL 2 MG/2ML IJ SOLN
INTRAMUSCULAR | Status: AC
  Filled 2014-11-12: qty 2

## 2014-11-12 MED ORDER — ONDANSETRON HCL 4 MG/2ML IJ SOLN
INTRAMUSCULAR | Status: AC
  Filled 2014-11-12: qty 4

## 2014-11-12 MED ORDER — LACTATED RINGERS IV SOLN
INTRAVENOUS | Status: DC | PRN
  Administered 2014-11-12 (×2): via INTRAVENOUS

## 2014-11-12 MED ORDER — CHLORHEXIDINE GLUCONATE 4 % EX LIQD: 1.0000 "application " | Freq: Once | CUTANEOUS | Status: DC

## 2014-11-12 MED ORDER — FENTANYL CITRATE 0.05 MG/ML IJ SOLN
INTRAMUSCULAR | Status: AC
  Filled 2014-11-12: qty 5

## 2014-11-12 MED ORDER — ONDANSETRON HCL 4 MG/2ML IJ SOLN
INTRAMUSCULAR | Status: DC | PRN
  Administered 2014-11-12: 4 mg via INTRAVENOUS

## 2014-11-12 MED ORDER — POVIDONE-IODINE 10 % OINT PACKET
TOPICAL_OINTMENT | CUTANEOUS | Status: DC | PRN
  Administered 2014-11-12: 1 via TOPICAL

## 2014-11-12 MED ORDER — PROPOFOL 10 MG/ML IV BOLUS
INTRAVENOUS | Status: DC | PRN
  Administered 2014-11-12: 150 mg via INTRAVENOUS

## 2014-11-12 MED ORDER — ROCURONIUM BROMIDE 50 MG/5ML IV SOLN
INTRAVENOUS | Status: AC
  Filled 2014-11-12: qty 1

## 2014-11-12 MED ORDER — ROCURONIUM BROMIDE 100 MG/10ML IV SOLN
INTRAVENOUS | Status: DC | PRN
  Administered 2014-11-12: 30 mg via INTRAVENOUS
  Administered 2014-11-12: 5 mg via INTRAVENOUS

## 2014-11-12 MED ORDER — DEXAMETHASONE SODIUM PHOSPHATE 10 MG/ML IJ SOLN
INTRAMUSCULAR | Status: DC | PRN
  Administered 2014-11-12: 4 mg via INTRAVENOUS

## 2014-11-12 MED ORDER — CIPROFLOXACIN IN D5W 200 MG/100ML IV SOLN
INTRAVENOUS | Status: AC
  Filled 2014-11-12: qty 200

## 2014-11-12 SURGICAL SUPPLY — 41 items
APPLIER CLIP LAPSCP 10X32 DD (CLIP) ×2 IMPLANT
BAG HAMPER (MISCELLANEOUS) ×2 IMPLANT
BLADE 11 SAFETY STRL DISP (BLADE) ×2 IMPLANT
CHLORAPREP W/TINT 26ML (MISCELLANEOUS) ×2 IMPLANT
CLOTH BEACON ORANGE TIMEOUT ST (SAFETY) ×2 IMPLANT
COVER LIGHT HANDLE STERIS (MISCELLANEOUS) ×4 IMPLANT
DECANTER SPIKE VIAL GLASS SM (MISCELLANEOUS) ×2 IMPLANT
ELECT REM PT RETURN 9FT ADLT (ELECTROSURGICAL) ×2
ELECTRODE REM PT RTRN 9FT ADLT (ELECTROSURGICAL) ×1 IMPLANT
FILTER SMOKE EVAC LAPAROSHD (FILTER) ×2 IMPLANT
FORMALIN 10 PREFIL 120ML (MISCELLANEOUS) ×2 IMPLANT
GLOVE BIOGEL PI IND STRL 7.0 (GLOVE) ×3 IMPLANT
GLOVE BIOGEL PI INDICATOR 7.0 (GLOVE) ×3
GLOVE ECLIPSE 7.0 STRL STRAW (GLOVE) ×2 IMPLANT
GLOVE SS BIOGEL STRL SZ 6.5 (GLOVE) ×1 IMPLANT
GLOVE SUPERSENSE BIOGEL SZ 6.5 (GLOVE) ×1
GLOVE SURG SS PI 7.5 STRL IVOR (GLOVE) ×2 IMPLANT
GOWN STRL REUS W/ TWL XL LVL3 (GOWN DISPOSABLE) ×1 IMPLANT
GOWN STRL REUS W/TWL LRG LVL3 (GOWN DISPOSABLE) ×4 IMPLANT
GOWN STRL REUS W/TWL XL LVL3 (GOWN DISPOSABLE) ×2
HEMOSTAT SNOW SURGICEL 2X4 (HEMOSTASIS) ×2 IMPLANT
INST SET LAPROSCOPIC AP (KITS) ×2 IMPLANT
KIT ROOM TURNOVER APOR (KITS) ×2 IMPLANT
MANIFOLD NEPTUNE II (INSTRUMENTS) ×2 IMPLANT
NEEDLE INSUFFLATION 14GA 120MM (NEEDLE) ×2 IMPLANT
NS IRRIG 1000ML POUR BTL (IV SOLUTION) ×2 IMPLANT
PACK LAP CHOLE LZT030E (CUSTOM PROCEDURE TRAY) ×2 IMPLANT
PAD ARMBOARD 7.5X6 YLW CONV (MISCELLANEOUS) ×2 IMPLANT
POUCH SPECIMEN RETRIEVAL 10MM (ENDOMECHANICALS) ×2 IMPLANT
SET BASIN LINEN APH (SET/KITS/TRAYS/PACK) ×2 IMPLANT
SLEEVE ENDOPATH XCEL 5M (ENDOMECHANICALS) ×2 IMPLANT
SPONGE GAUZE 2X2 8PLY STRL LF (GAUZE/BANDAGES/DRESSINGS) ×8 IMPLANT
STAPLER VISISTAT (STAPLE) ×2 IMPLANT
SUT VICRYL 0 UR6 27IN ABS (SUTURE) ×2 IMPLANT
TAPE CLOTH SURG 4X10 WHT LF (GAUZE/BANDAGES/DRESSINGS) ×2 IMPLANT
TROCAR ENDO BLADELESS 11MM (ENDOMECHANICALS) ×2 IMPLANT
TROCAR XCEL NON-BLD 5MMX100MML (ENDOMECHANICALS) ×2 IMPLANT
TROCAR XCEL UNIV SLVE 11M 100M (ENDOMECHANICALS) ×2 IMPLANT
TUBING INSUFFLATION (TUBING) ×2 IMPLANT
WARMER LAPAROSCOPE (MISCELLANEOUS) ×2 IMPLANT
YANKAUER SUCT 12FT TUBE ARGYLE (SUCTIONS) ×2 IMPLANT

## 2014-11-12 NOTE — Anesthesia Preprocedure Evaluation (Signed)
Anesthesia Evaluation  Patient identified by MRN, date of birth, ID band Patient awake    Reviewed: Allergy & Precautions, NPO status , Patient's Chart, lab work & pertinent test results  Airway Mallampati: I  TM Distance: >3 FB     Dental  (+) Teeth Intact   Pulmonary Current Smoker,  breath sounds clear to auscultation        Cardiovascular negative cardio ROS  Rhythm:Regular Rate:Normal     Neuro/Psych    GI/Hepatic negative GI ROS, Gallstone pancreatitis   Endo/Other    Renal/GU      Musculoskeletal   Abdominal   Peds  Hematology   Anesthesia Other Findings   Reproductive/Obstetrics                             Anesthesia Physical Anesthesia Plan  ASA: II  Anesthesia Plan: General   Post-op Pain Management:    Induction: Intravenous  Airway Management Planned: Oral ETT  Additional Equipment:   Intra-op Plan:   Post-operative Plan: Extubation in OR  Informed Consent: I have reviewed the patients History and Physical, chart, labs and discussed the procedure including the risks, benefits and alternatives for the proposed anesthesia with the patient or authorized representative who has indicated his/her understanding and acceptance.     Plan Discussed with:   Anesthesia Plan Comments:         Anesthesia Quick Evaluation

## 2014-11-12 NOTE — Op Note (Signed)
Patient:  Mike EvertsLarry W Grose  DOB:  1956-02-05  MRN:  [id number]   Preop Diagnosis:  Cholelithiasis, history of gallstone pancreatitis  Postop Diagnosis:  Same  Procedure:  Laparoscopic cholecystectomy  Surgeon:  Franky MachoMark Leroy Trim, M.D.  Anes:  Gen. endotracheal  Indications:  Patient is a 59 year old white male recently hospitalized with gallstone pancreatitis who now presents for laparoscopic cholecystectomy. The risks and benefits of the procedure including bleeding, infection, hepatobiliary injury, and the possibility of an open procedure were fully explained to the patient, who gave informed consent.  Procedure note:  The patient was placed the supine position. After induction of general endotracheal anesthesia, the abdomen was prepped and draped using usual sterile technique with ChloraPrep. Surgical site confirmation was performed.  An infraumbilical incision was made down to the fascia. A Veress needle was introduced into the abdominal cavity and confirmation of placement was done using the saline drop test. The abdomen was then insufflated to 16 mmHg pressure. An 11 mm trocar was introduced into the abdominal cavity under direct visualization without difficulty. The patient was placed in reverse Trendelenburg position and additional 11 mm trocar was placed the epigastric region and 5 mm trochars were placed the right upper quadrant and right flank regions. Liver was inspected and noted to be within normal limits. The gallbladder was retracted in a dynamic fashion in order to expose the triangle of Calot. The cystic duct was first identified. Its junction to the infundibulum was fully identified. Endoclips were placed proximally and distally on the cystic duct, and the cystic duct was divided. This was likewise done to the cystic artery. The gallbladder was freed away from the gallbladder fossa using Bovie electrocautery. The gallbladder was delivered through the epigastric trocar site using an Endo  Catch bag. The gallbladder fossa was inspected no abnormal bleeding or bile leakage was noted. Surgicel is placed the gallbladder fossa. All fluid and air were then evacuated from the abdominal cavity prior to removal of the trochars.  All wounds were irrigated with normal saline. All wounds were injected with 0.5% Sensorcaine. The infraumbilical fashion as well as epigastric fascia reapproximated using 0 Vicryl interrupted suture. All skin incisions were closed using staples. Betadine ointment and dry sterile dressings were applied.  All tape and needle counts were correct at the end of the procedure. The patient was extubated in the operating room and transferred to PACU in stable condition.  Complications:  None  EBL:  Minimal  Specimen:  Gallbladder

## 2014-11-12 NOTE — Interval H&P Note (Signed)
History and Physical Interval Note:  11/12/2014 9:46 AM  Mike EvertsLarry W Mitcham  has presented today for surgery, with the diagnosis of cholelithiasis  The various methods of treatment have been discussed with the patient and family. After consideration of risks, benefits and other options for treatment, the patient has consented to  Procedure(s): LAPAROSCOPIC CHOLECYSTECTOMY (N/A) as a surgical intervention .  The patient's history has been reviewed, patient examined, no change in status, stable for surgery.  I have reviewed the patient's chart and labs.  Questions were answered to the patient's satisfaction.     Franky MachoJENKINS,Jesse Hirst A

## 2014-11-12 NOTE — Transfer of Care (Signed)
Immediate Anesthesia Transfer of Care Note  Patient: Mike EvertsLarry W Zara  Procedure(s) Performed: Procedure(s): LAPAROSCOPIC CHOLECYSTECTOMY (N/A)  Patient Location: PACU  Anesthesia Type:General  Level of Consciousness: awake, alert , patient cooperative and responds to stimulation  Airway & Oxygen Therapy: Patient Spontanous Breathing and Patient connected to face mask oxygen  Post-op Assessment: Report given to RN, Post -op Vital signs reviewed and stable, Patient moving all extremities and Patient moving all extremities X 4  Post vital signs: Reviewed and stable  Last Vitals:  Filed Vitals:   11/12/14 0950  BP: 106/74  Temp:   Resp: 18    Complications: No apparent anesthesia complications

## 2014-11-12 NOTE — Anesthesia Postprocedure Evaluation (Signed)
  Anesthesia Post-op Note  Patient: Mike Shelton  Procedure(s) Performed: Procedure(s): LAPAROSCOPIC CHOLECYSTECTOMY (N/A)  Patient Location: PACU  Anesthesia Type:General  Level of Consciousness: awake, alert , patient cooperative and responds to stimulation  Airway and Oxygen Therapy: Patient Spontanous Breathing and Patient connected to face mask oxygen  Post-op Pain: none  Post-op Assessment: Post-op Vital signs reviewed, Patient's Cardiovascular Status Stable, Respiratory Function Stable, Patent Airway, No signs of Nausea or vomiting and Pain level controlled  Post-op Vital Signs: Reviewed and stable  Last Vitals:  Filed Vitals:   11/12/14 0950  BP: 106/74  Temp:   Resp: 18    Complications: No apparent anesthesia complications

## 2014-11-12 NOTE — Discharge Instructions (Signed)

## 2014-11-12 NOTE — Anesthesia Procedure Notes (Signed)
Procedure Name: Intubation Date/Time: 11/12/2014 10:04 AM Performed by: Patrcia DollyMOSES, Christianjames Soule Pre-anesthesia Checklist: Patient identified, Patient being monitored, Timeout performed, Emergency Drugs available and Suction available Patient Re-evaluated:Patient Re-evaluated prior to inductionOxygen Delivery Method: Circle System Utilized Preoxygenation: Pre-oxygenation with 100% oxygen Intubation Type: IV induction Ventilation: Mask ventilation without difficulty Laryngoscope Size: Miller and 2 Grade View: Grade I Tube type: Oral Tube size: 7.0 mm Number of attempts: 1 Airway Equipment and Method: Stylet Placement Confirmation: ETT inserted through vocal cords under direct vision,  positive ETCO2 and breath sounds checked- equal and bilateral Secured at: 21 cm Tube secured with: Tape Dental Injury: Teeth and Oropharynx as per pre-operative assessment

## 2014-11-15 ENCOUNTER — Encounter (HOSPITAL_COMMUNITY): Payer: Self-pay | Admitting: General Surgery

## 2015-07-04 IMAGING — CT CT ABD-PELV W/ CM
2 of 5 series · 15 of 46 positions shown, 17 images · IV contrast (omnipaque)
Comparison: None.

CLINICAL DATA: Acute onset abdominal pain.  Right-sided pain.

EXAM:
CT ABDOMEN AND PELVIS WITH CONTRAST
TECHNIQUE: Multidetector CT imaging of the abdomen and pelvis was performed
using the standard protocol following bolus administration of
intravenous contrast.
CONTRAST:  50mL OMNIPAQUE IOHEXOL 300 MG/ML SOLN, 100mL OMNIPAQUE
IOHEXOL 300 MG/ML SOLN

[Series 2: abd_pel_with 5.0 b40f · axial · 0.59mm/px · z∈[-432,-32]mm · 12 of 92 slices shown, 14 images]
[im 6/92  soft-tissue]
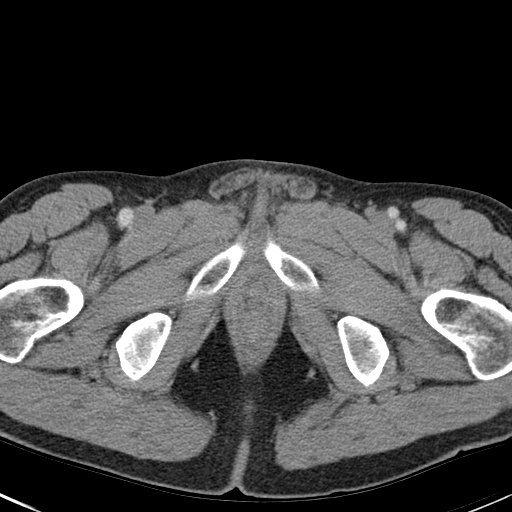
[im 6/92  bone]
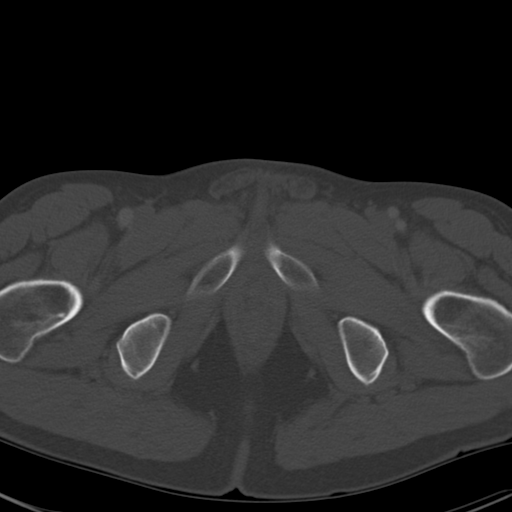
[im 16/92  soft-tissue]
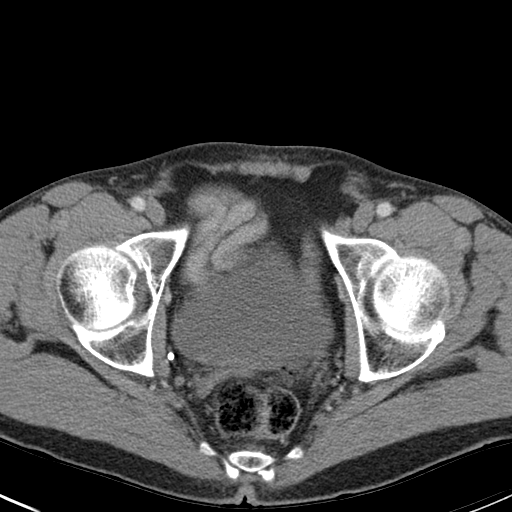
[im 21/92  soft-tissue]
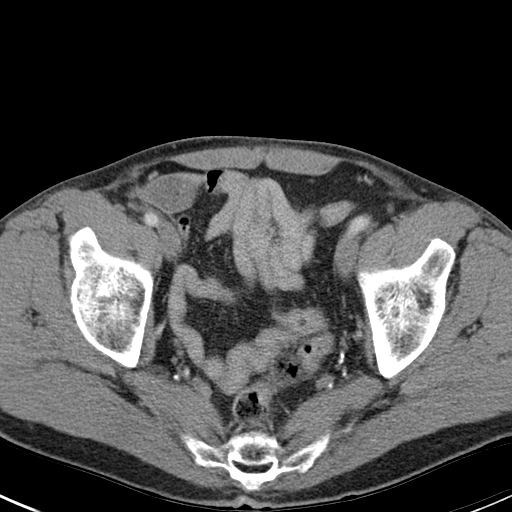
[im 26/92  soft-tissue]
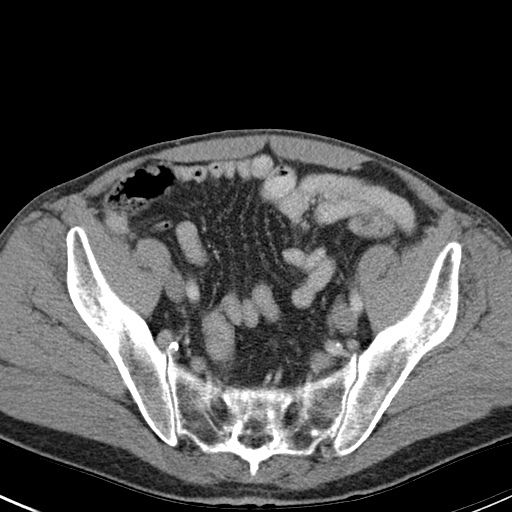
[im 36/92  soft-tissue]
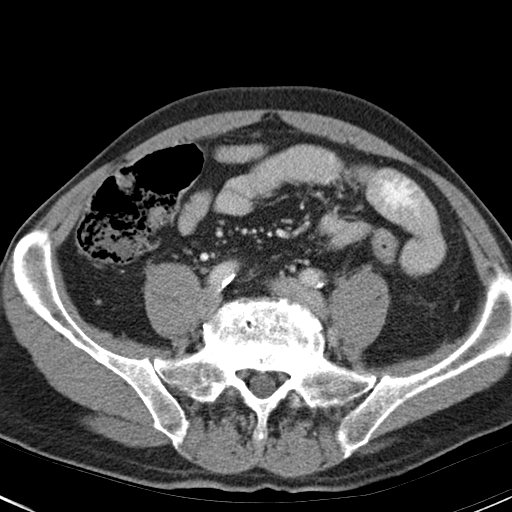
[im 41/92  soft-tissue]
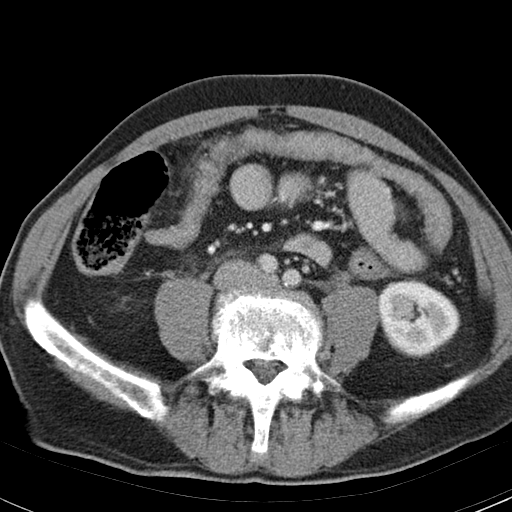
[im 51/92  soft-tissue]
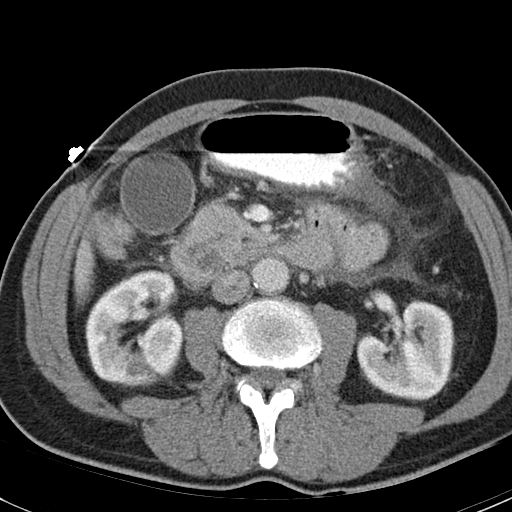
[im 56/92  soft-tissue]
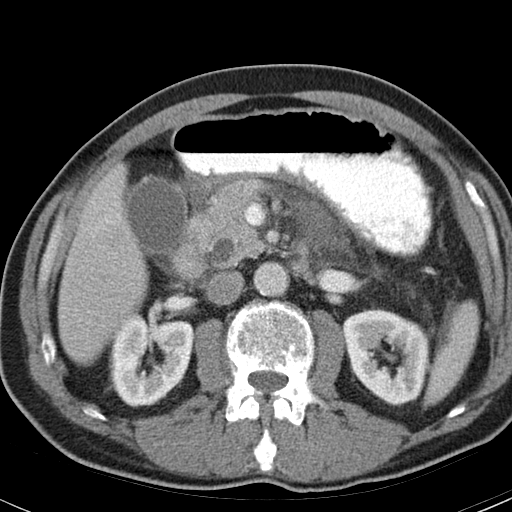
[im 66/92  soft-tissue]
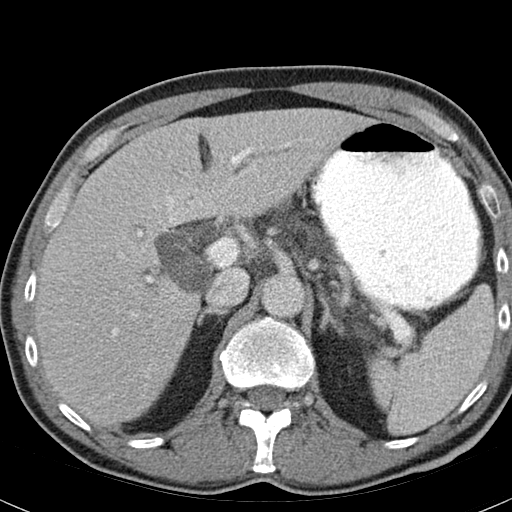
[im 66/92  bone]
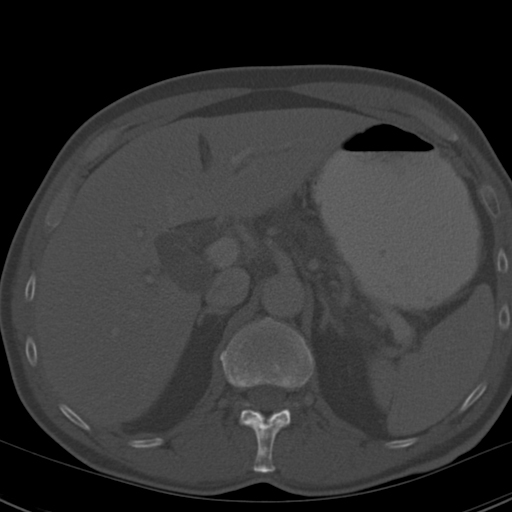
[im 71/92  soft-tissue]
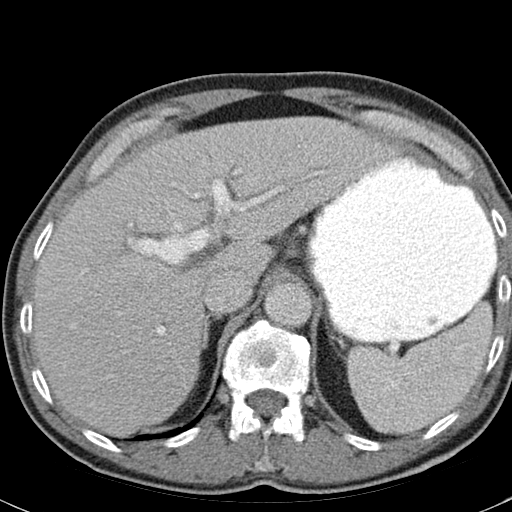
[im 76/92  soft-tissue]
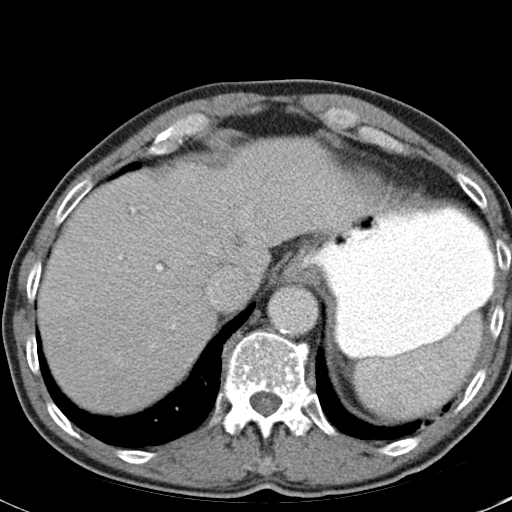
[im 86/92  soft-tissue]
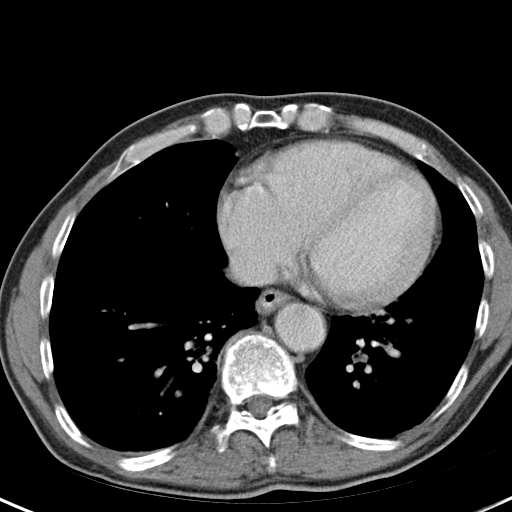

[Series 3: abd_pel_with 3.0 spo cor · coronal · 0.60mm/px · 3 of 94 slices shown]
[im 32/94  soft-tissue]
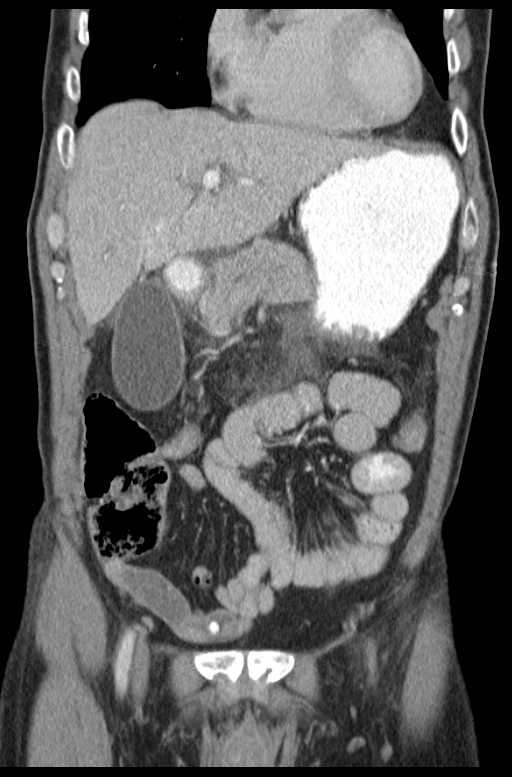
[im 42/94  soft-tissue]
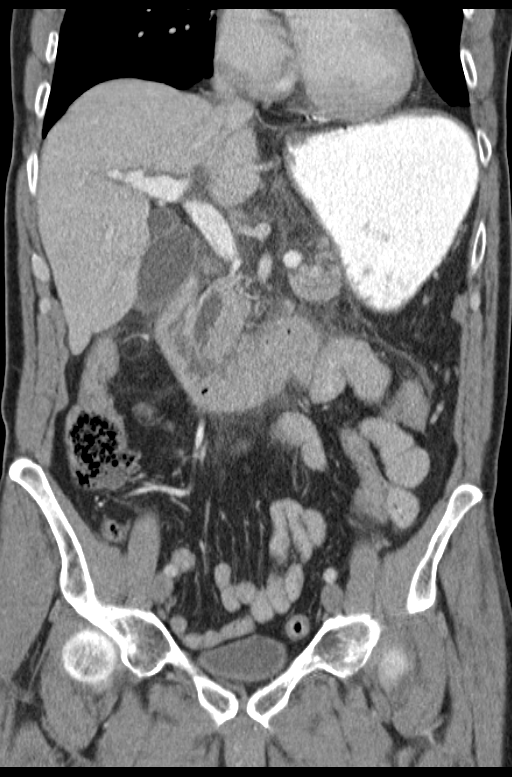
[im 52/94  soft-tissue]
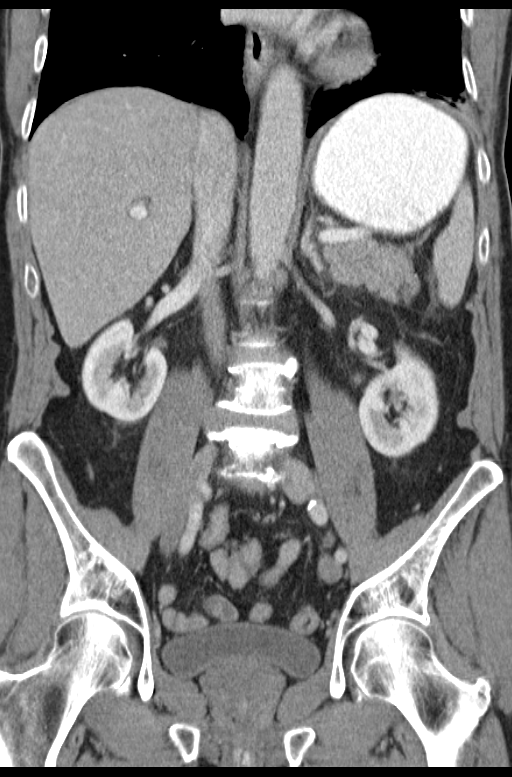

[15 of 46 positions shown; findings below may reference images not displayed]

FINDINGS: Lower chest: Lung base stones demonstrate mild atelectasis and
interstitial edema.

Hepatobiliary: No focal hepatic lesion. There is mild periportal
edema. Small amount of pericholecystic fluid. The gallbladder is
mildly distended to 45 mm. The common bile duct is dilated the
pancreatic head to 9 mm. No obstructing lesion identified. No
pancreatic duct dilatation.

Pancreas: There is extensive peripancreatic fluid extending along
the left and right anterior perirenal fascia but greater on the left
(image 43, series 2). There is edema and fluid at the tail. No
organized fluid collections. The pancreatic parenchyma is mildly
hypo attenuating. No organized fluid collections. No pancreatic duct
dilatation.

Spleen: Normal spleen.

Adrenals/urinary tract: Adrenal glands are normal. The kidneys,
ureters, and bladder are normal. There is a fat density lesion
within the cortex of the right kidney measuring 16 mm (image 45,
series 2). This likely represents a small angiomyolipoma.

Stomach/Bowel: The stomach, duodenum, small bowel, appendix, cecum
normal. The colon and rectosigmoid colon are normal.

Vascular/Lymphatic: Abdominal or is normal caliber. No vascular
complication. Portal veins are patent. Splenic vein is patent.

Reproductive: Prostate gland is normal.

Musculoskeletal: No aggressive osseous lesion. There are multiple
small sclerotic lesions within the bones of the pelvis, sacrum, and
spine. These likely represent benign bone islands but are
indeterminate.
IMPRESSION: 1. Acute pancreatitis.  No organized fluid collection.
2. Dilatation of the common bile duct and distension of gallbladder.
Concern for gallstone pancreatitis. No gallstones are evident by CT.
Consider ultrasound or MRCP evaluation for non radiodense stones.
3. Multiple small sclerotic lesions throughout the pelvis and spine.
These likely represent small benign bone islands (osteopoikilosis) ;
however recommend correlation with PSA as metastasis cannot be
excluded.

## 2018-01-24 ENCOUNTER — Ambulatory Visit (INDEPENDENT_AMBULATORY_CARE_PROVIDER_SITE_OTHER): Payer: BLUE CROSS/BLUE SHIELD

## 2018-01-24 ENCOUNTER — Ambulatory Visit (HOSPITAL_COMMUNITY)
Admission: EM | Admit: 2018-01-24 | Discharge: 2018-01-24 | Disposition: A | Discharge status: Home / Self Care | Payer: BLUE CROSS/BLUE SHIELD | Attending: Internal Medicine | Admitting: Internal Medicine

## 2018-01-24 ENCOUNTER — Encounter (HOSPITAL_COMMUNITY): Payer: Self-pay | Admitting: Emergency Medicine

## 2018-01-24 DIAGNOSIS — J189 Pneumonia, unspecified organism: Secondary | ICD-10-CM

## 2018-01-24 DIAGNOSIS — J181 Lobar pneumonia, unspecified organism: Secondary | ICD-10-CM

## 2018-01-24 MED ORDER — ALBUTEROL SULFATE (2.5 MG/3ML) 0.083% IN NEBU
INHALATION_SOLUTION | RESPIRATORY_TRACT | Status: AC
  Filled 2018-01-24: qty 3

## 2018-01-24 MED ORDER — AZITHROMYCIN 250 MG PO TABS: 250.0000 mg | ORAL_TABLET | Freq: Every day | ORAL | 0 refills | Status: AC

## 2018-01-24 MED ORDER — ALBUTEROL SULFATE HFA 108 (90 BASE) MCG/ACT IN AERS
INHALATION_SPRAY | RESPIRATORY_TRACT | Status: AC
  Filled 2018-01-24: qty 6.7

## 2018-01-24 MED ORDER — ACETAMINOPHEN 325 MG PO TABS
650.0000 mg | ORAL_TABLET | Freq: Once | ORAL | Status: AC
  Administered 2018-01-24: 650 mg via ORAL

## 2018-01-24 MED ORDER — ALBUTEROL SULFATE HFA 108 (90 BASE) MCG/ACT IN AERS
2.0000 | INHALATION_SPRAY | Freq: Once | RESPIRATORY_TRACT | Status: AC
  Administered 2018-01-24: 2 via RESPIRATORY_TRACT

## 2018-01-24 MED ORDER — ACETAMINOPHEN 325 MG PO TABS
ORAL_TABLET | ORAL | Status: AC
  Filled 2018-01-24: qty 2

## 2018-01-24 MED ORDER — CETIRIZINE-PSEUDOEPHEDRINE ER 5-120 MG PO TB12: 1.0000 | ORAL_TABLET | Freq: Every day | ORAL | 0 refills | Status: AC

## 2018-01-24 NOTE — Discharge Instructions (Addendum)
Albuterol inhaler given in office Push fluids and get plenty of rest Alternate ibuprofen and tylenol as needed for symptomatic relief Prescribed antibiotic. Take as directed and to completion Prescribed zyrtec D as needed for symptomatic relief Follow up with PCP if symptoms persists Return or go to the ER if you have any new or worsening symptoms  Recommend repeat CXR in 6-8 weeks to ensure complete resolution of x-ray findings

## 2018-01-24 NOTE — ED Provider Notes (Signed)
[facility]   [id number] 01/24/18 Arrival Time: 1901  SUBJECTIVE:  Mike Shelton is a 62 y.o. male who presents with worsening cough for the past 2 weeks. Admits to positive sick exposure.  Describes cough as intermittent and productive with clear sputum.  Has tried sinex cold tablets with temporary relief.  Denies aggravating symptoms.  Denies previous symptoms in the past.  Complains of fever, chills, fatigue, rhinorrhea, sinus pressure, sore throat, SOB, and wheezing.  Denies chest pain, nausea, abdominal pain, changes in bowel or bladder habits.    Tobacco hx of 42 years 2PPD  ROS: As per HPI.  History reviewed. No pertinent past medical history. Past Surgical History:  Procedure Laterality Date  . CHOLECYSTECTOMY N/A 11/12/2014   Procedure: LAPAROSCOPIC CHOLECYSTECTOMY;  Surgeon: Franky Macho Md, MD;  Location: [facility];  Service: General;  Laterality: N/A;  . KNEE SURGERY     Allergies  Allergen Reactions  . Amoxicillin-Pot Clavulanate Rash   No current facility-administered medications on file prior to encounter.    Current Outpatient Medications on File Prior to Encounter  Medication Sig Dispense Refill  . Chlorpheniramine Maleate (ALLERGY RELIEF PO) Take 1 tablet by mouth 2 (two) times daily as needed (cold).    Marland Kitchen oxyCODONE-acetaminophen (PERCOCET) 7.5-325 MG per tablet Take 1-2 tablets by mouth every 4 (four) hours as needed. 50 tablet 0     Social History   Socioeconomic History  . Marital status: Married    Spouse name: Not on file  . Number of children: Not on file  . Years of education: Not on file  . Highest education level: Not on file  Occupational History  . Not on file  Social Needs  . Financial resource strain: Not on file  . Food insecurity:    Worry: Not on file    Inability: Not on file  . Transportation needs:    Medical: Not on file    Non-medical: Not on file  Tobacco Use  . Smoking status: Current Every Day Smoker    Packs/day:  1.50    Years: 40.00    Pack years: 60.00  . Smokeless tobacco: Never Used  Substance and Sexual Activity  . Alcohol use: No  . Drug use: No  . Sexual activity: Not on file  Lifestyle  . Physical activity:    Days per week: Not on file    Minutes per session: Not on file  . Stress: Not on file  Relationships  . Social connections:    Talks on phone: Not on file    Gets together: Not on file    Attends religious service: Not on file    Active member of club or organization: Not on file    Attends meetings of clubs or organizations: Not on file    Relationship status: Not on file  . Intimate partner violence:    Fear of current or ex partner: Not on file    Emotionally abused: Not on file    Physically abused: Not on file    Forced sexual activity: Not on file  Other Topics Concern  . Not on file  Social History Narrative  . Not on file   Family History  Problem Relation Age of Onset  . Stroke Mother   . Lung cancer Father      OBJECTIVE:  Vitals:   01/24/18 1919  BP: 139/84  Pulse: (!) 103  Resp: 18  Temp: (!) 101.5 F (38.6 C)  SpO2: 96%  General appearance: AOx3 in no acute distress HEENT: PERRL.  EOM grossly intact.  Sinuses nontender; mild clear rhinorrhea; tonsils nonerythematous, uvula midline Neck: supple without LAD Lungs: Rales heard LLL Heart: regular rate and rhythm.  Radial pulses 2+ symmetrical bilaterally Skin: warm and dry Psychological: alert and cooperative; normal mood and affect   DIAGNOSTIC: CLINICAL DATA: Cough  EXAM: CHEST - 2 VIEW  COMPARISON: 11/11/2006  FINDINGS: Hyperinflation with chronic bronchitic changes. Streaky infiltrate in the left lower lobe. No pleural effusion. Normal heart size. No pneumothorax.  IMPRESSION: Streaky left lower lobe opacity suspicious for a pneumonia. Radiographic follow-up to resolution is recommended. Diffuse bronchitic changes.   Electronically Signed By: Jasmine Pang  M.D. On: 01/24/2018 20:07    I have reviewed the x-rays and the radiologist interpretation. I am in agreement with the radiologist interpretation.    ASSESSMENT & PLAN:  1. Pneumonia of left lower lobe due to infectious organism [facility])     Meds ordered this encounter  Medications  . acetaminophen (TYLENOL) tablet 650 mg  . albuterol (PROVENTIL HFA;VENTOLIN HFA) 108 (90 Base) MCG/ACT inhaler 2 puff  . azithromycin (ZITHROMAX) 250 MG tablet    Sig: Take 1 tablet (250 mg total) by mouth daily. Take first 2 tablets together, then 1 every day until finished.    Dispense:  6 tablet    Refill:  0    Order Specific Question:   Supervising Provider    Answer:   Isa Rankin [phone]  . cetirizine-pseudoephedrine (ZYRTEC-D) 5-120 MG tablet    Sig: Take 1 tablet by mouth daily.    Dispense:  30 tablet    Refill:  0    Order Specific Question:   Supervising Provider    Answer:   Isa Rankin [[id number]]    Albuterol inhaler given in office Push fluids and get plenty of rest Alternate ibuprofen and tylenol as needed for symptomatic relief Prescribed antibiotic. Take as directed and to completion Prescribed zyrtec D as needed for symptomatic relief Follow up with PCP if symptoms persists Return or go to the ER if you have any new or worsening symptoms  Recommend repeat CXR in 6-8 weeks to ensure complete resolution of x-ray findings   Reviewed expectations re: course of current medical issues. Questions answered. Outlined signs and symptoms indicating need for more acute intervention. Patient verbalized understanding. After Visit Summary given.          [name], PA-C 01/24/18 2026

## 2018-01-24 NOTE — ED Triage Notes (Signed)
Pt c/o chest congestion, cold symptoms, coughing, headache. Pt c/o symptoms x2 weeks.

## 2018-09-27 IMAGING — DX DG CHEST 2V
3 series · 3 of 3 positions shown · non-contrast
Comparison: 11/11/2006

CLINICAL DATA: Cough

EXAM:
CHEST - 2 VIEW

[chest pa]
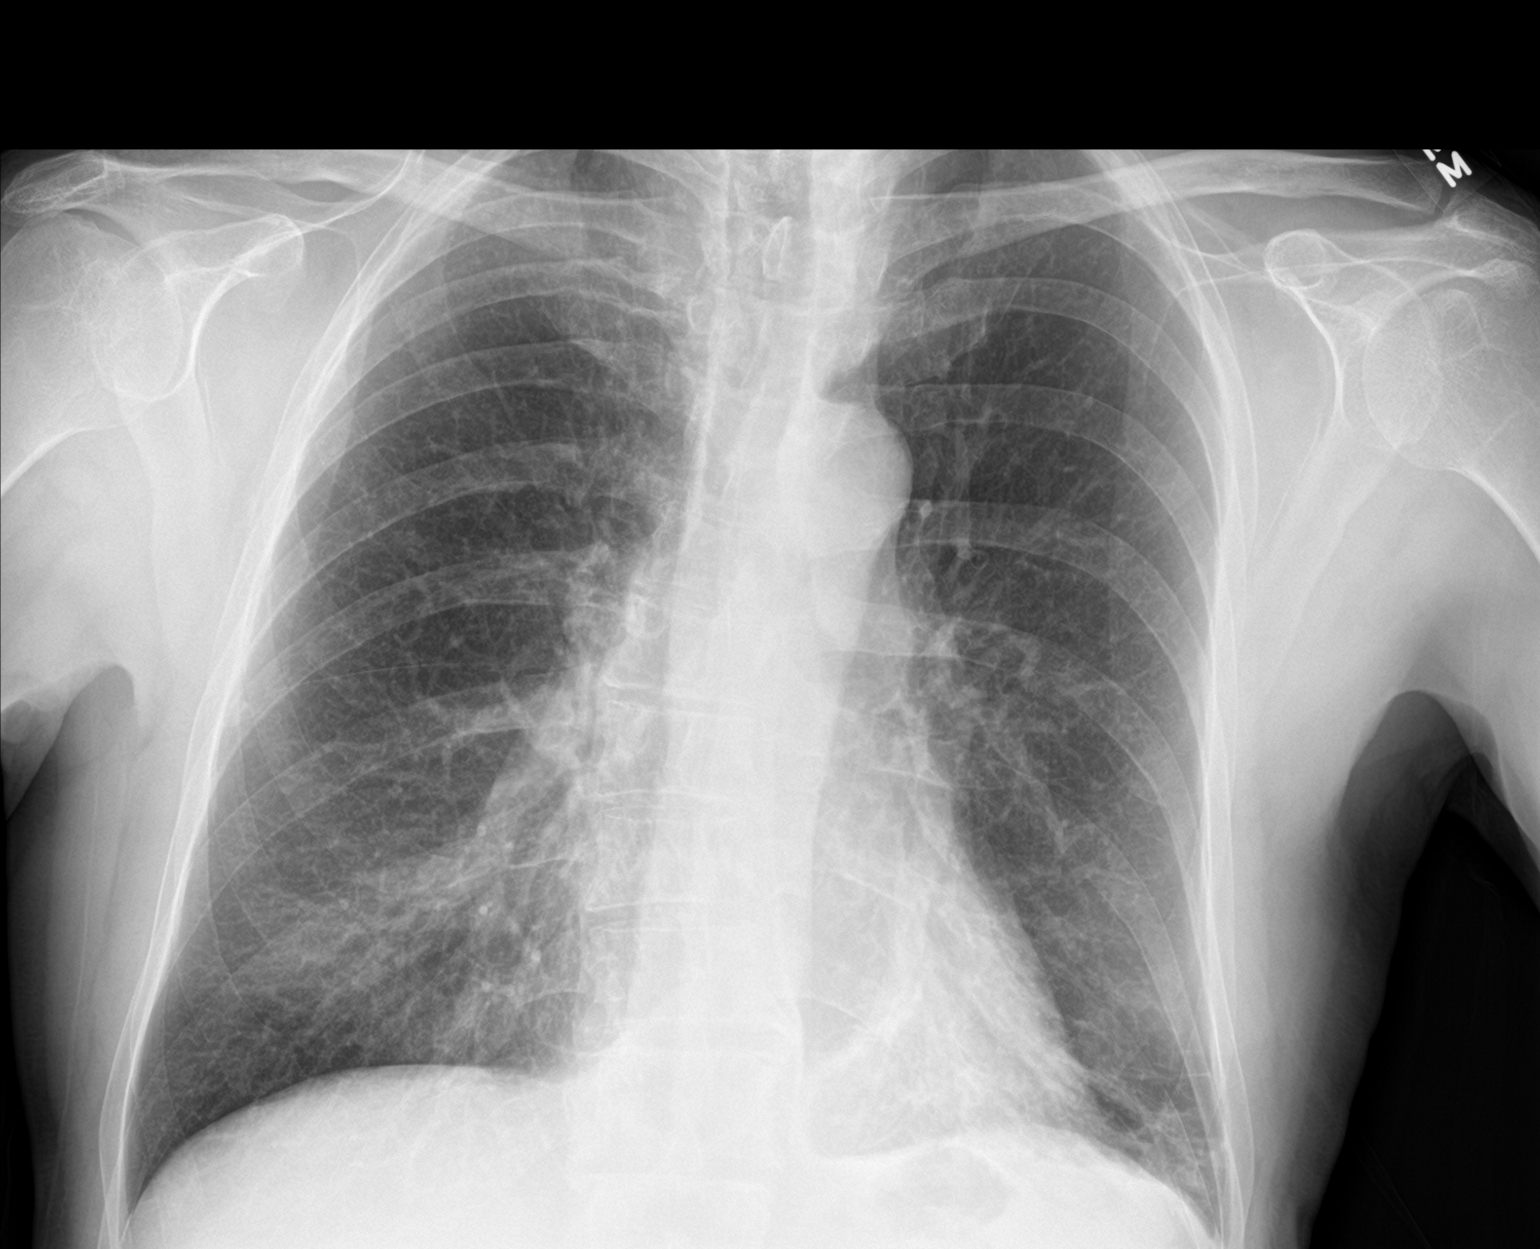

[chest lat (1 of 2)]
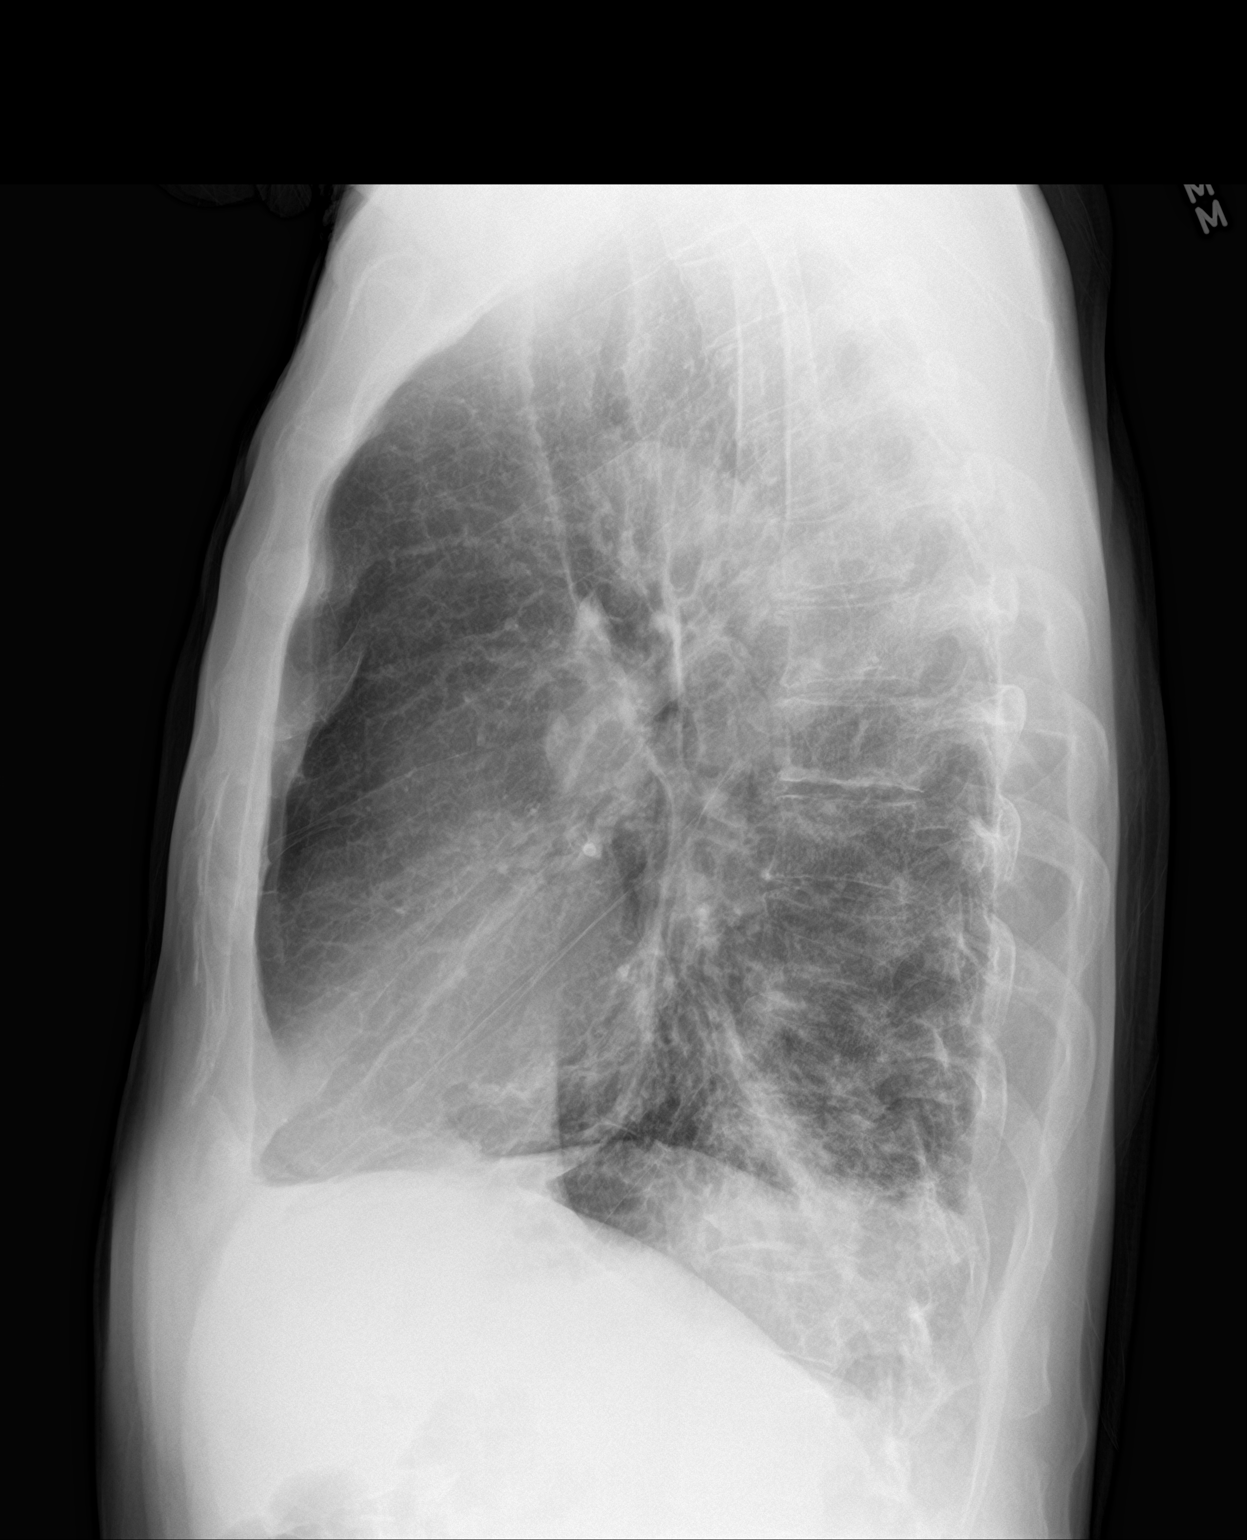

[chest lat (2 of 2)]
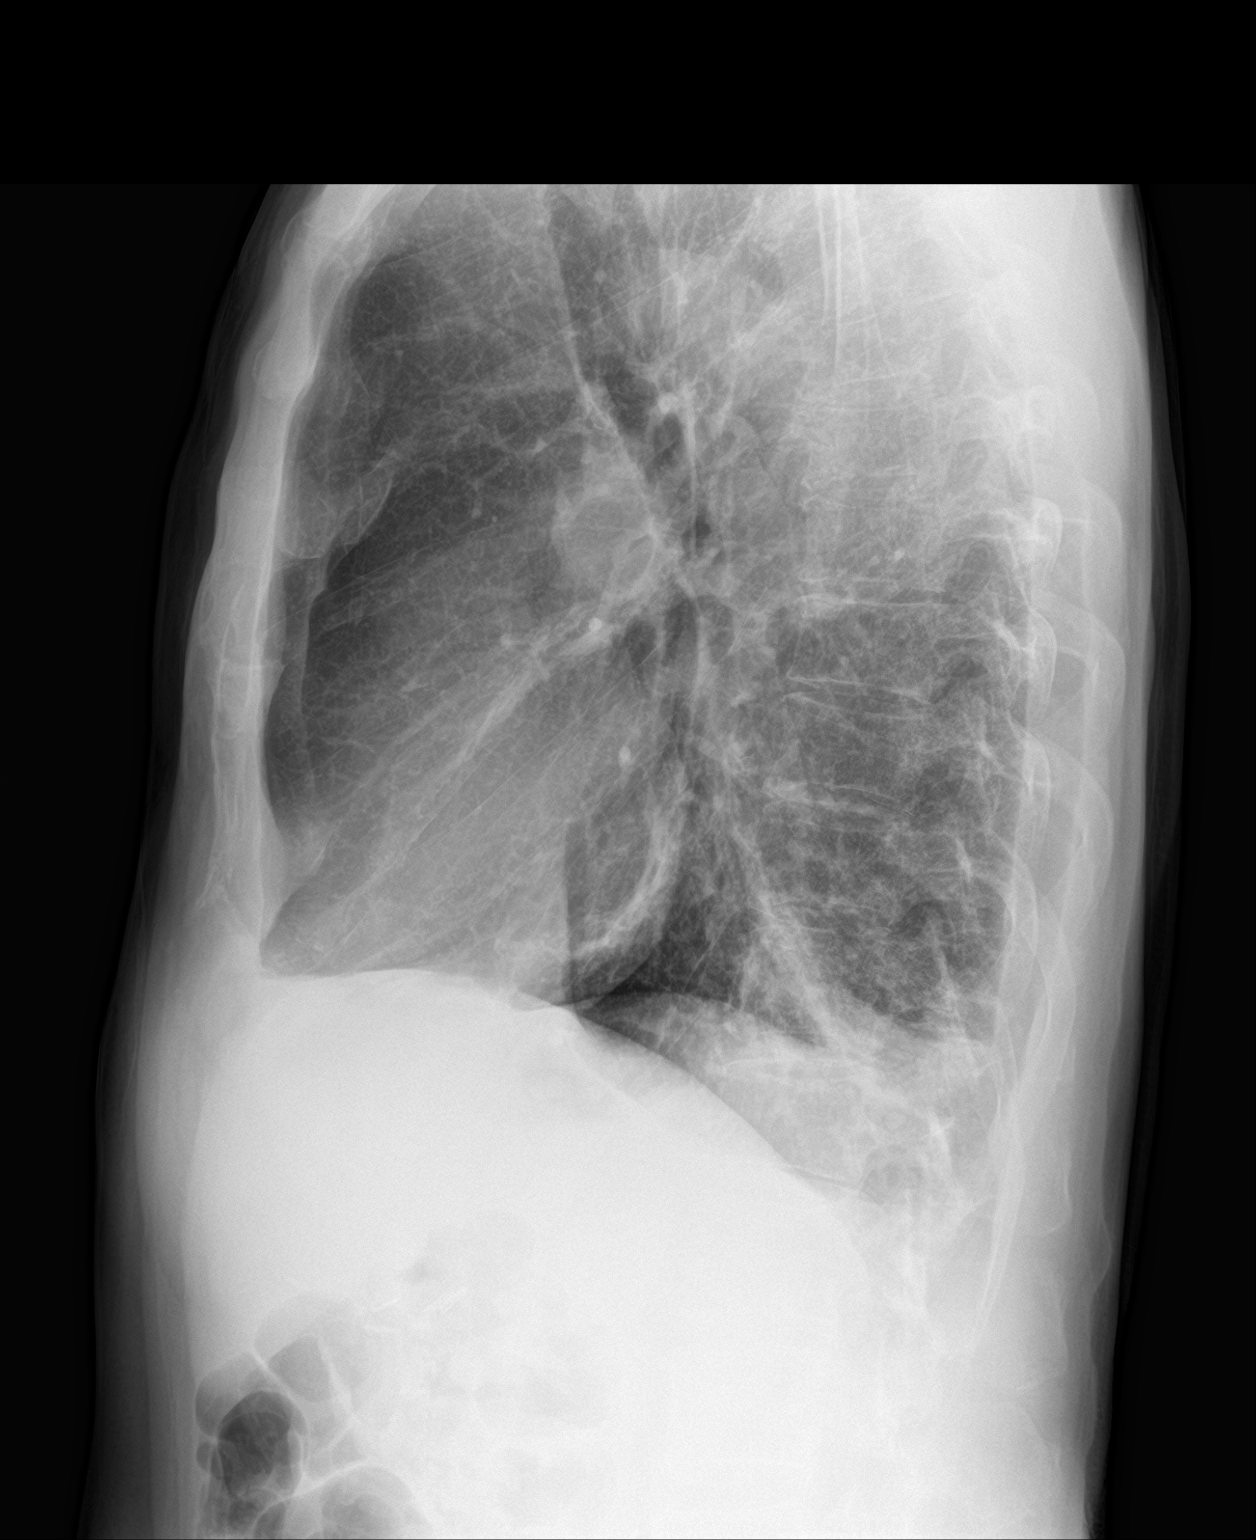

[3 of 3 positions shown; findings below may reference images not displayed]

FINDINGS: Hyperinflation with chronic bronchitic changes. Streaky infiltrate
in the left lower lobe. No pleural effusion. Normal heart size. No
pneumothorax.
IMPRESSION: Streaky left lower lobe opacity suspicious for a pneumonia.
Radiographic follow-up to resolution is recommended. Diffuse
bronchitic changes.

## 2021-05-17 ENCOUNTER — Telehealth: Payer: Self-pay | Admitting: Dermatology

## 2021-05-17 NOTE — Telephone Encounter (Signed)
Referral attached to appointment

## 2021-05-17 NOTE — Telephone Encounter (Signed)
Patient is calling for a referral appointment from [facility], DO.  Patient is scheduled for 11/14/2021 at 3:00 with Janalyn Harder, M.D.

## 2021-11-14 ENCOUNTER — Ambulatory Visit: Payer: BLUE CROSS/BLUE SHIELD | Admitting: Dermatology
